# Patient Record
Sex: Male | Born: 2000 | Race: Black or African American | Hispanic: No | Marital: Single | Smoking: Current some day smoker
Health system: Southern US, Community
[De-identification: ages and names within clinical notes are randomized; demographics above are authoritative.]

## PROBLEM LIST (undated history)

## (undated) ENCOUNTER — Emergency Department (HOSPITAL_COMMUNITY): Admission: EM | Payer: Medicaid Other | Source: Home / Self Care

---
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
## 2018-12-29 ENCOUNTER — Telehealth: Payer: Self-pay | Admitting: Orthopedic Surgery

## 2018-12-29 NOTE — Telephone Encounter (Signed)
Patient's mom called to set up an appointment with Dr. Romeo Apple for the patient. I explained that since he has Medicaid he would need to be referred by his PCP. I also explained that since he is of age, when we get the referral we would have to speak with him to schedule the appointment.

## 2019-02-21 ENCOUNTER — Encounter (HOSPITAL_COMMUNITY): Payer: Self-pay

## 2019-02-21 ENCOUNTER — Other Ambulatory Visit: Payer: Self-pay

## 2019-02-21 ENCOUNTER — Emergency Department (HOSPITAL_COMMUNITY)
Admission: EM | Admit: 2019-02-21 | Discharge: 2019-02-21 | Disposition: A | Discharge status: Home / Self Care | Payer: Self-pay | Attending: Emergency Medicine | Admitting: Emergency Medicine

## 2019-02-21 ENCOUNTER — Emergency Department (HOSPITAL_COMMUNITY): Payer: Self-pay

## 2019-02-21 DIAGNOSIS — Y9389 Activity, other specified: Secondary | ICD-10-CM | POA: Insufficient documentation

## 2019-02-21 DIAGNOSIS — S6702XA Crushing injury of left thumb, initial encounter: Secondary | ICD-10-CM | POA: Insufficient documentation

## 2019-02-21 DIAGNOSIS — Y929 Unspecified place or not applicable: Secondary | ICD-10-CM | POA: Insufficient documentation

## 2019-02-21 DIAGNOSIS — W231XXA Caught, crushed, jammed, or pinched between stationary objects, initial encounter: Secondary | ICD-10-CM | POA: Insufficient documentation

## 2019-02-21 DIAGNOSIS — Y998 Other external cause status: Secondary | ICD-10-CM | POA: Insufficient documentation

## 2019-02-21 DIAGNOSIS — S6710XA Crushing injury of unspecified finger(s), initial encounter: Secondary | ICD-10-CM

## 2019-02-21 DIAGNOSIS — S6010XA Contusion of unspecified finger with damage to nail, initial encounter: Secondary | ICD-10-CM

## 2019-02-21 DIAGNOSIS — S60112A Contusion of left thumb with damage to nail, initial encounter: Secondary | ICD-10-CM | POA: Insufficient documentation

## 2019-02-21 MED ORDER — ACETAMINOPHEN 500 MG PO TABS
1000.0000 mg | ORAL_TABLET | Freq: Once | ORAL | Status: AC
  Administered 2019-02-21: 1000 mg via ORAL
  Filled 2019-02-21: qty 2

## 2019-02-21 NOTE — ED Provider Notes (Addendum)
[facility] EMERGENCY DEPARTMENT Provider Note   CSN: [id number] Arrival date & time: 02/21/19  [id number]    History   Chief Complaint Chief Complaint  Patient presents with  . Finger Injury    left thumb    HPI Frederick Stewart is a 18 y.o. male.     Patient c/o accidentally shutting left thumb in car door last night. Pain is acute onset, constant, moderate, persistent, worse w palpation. Skin is intact. Denies other pain or injury. Right hand dominant. Has not taken anything for pain as of yet.     The history is provided by the patient.    No past medical history on file.  Patient Active Problem List   Diagnosis Date Noted  . Eczema 05/29/2015  . Sports physical 08/21/2013  . Prolonged Q-T interval on ECG 08/21/2013  . Bradycardia 08/21/2013  . Well child check 08/18/2013  . Need for prophylactic vaccination and inoculation against influenza 08/18/2013  . BMI (body mass index), pediatric, 5% to less than 85% for age 52/24/2014  . Acne 08/18/2013    No past surgical history on file.      Home Medications    Prior to Admission medications   Medication Sig Start Date End Date Taking? Authorizing Provider  acetaminophen-codeine (TYLENOL #3) 300-30 MG per tablet Take 1 tablet by mouth every 6 (six) hours as needed for moderate pain. Patient not taking: Reported on 11/27/2014 10/15/14   Vickki HearingHarrison, Stanley E, MD  BENZACLIN gel Apply topically 2 (two) times daily. Patient not taking: Reported on 01/06/2017 03/16/14   Martyn EhrichKhalifa, Dalia A, MD  hydrocortisone 2.5 % ointment Apply topically 2 (two) times daily. 05/29/15   Lurene ShadowGnanasekaran, Kavithashree, MD  hydrocortisone valerate cream (WESTCORT) 0.2 % Apply 1 application topically 2 (two) times daily. 03/16/14   Laurell JosephsKhalifa, Dalia A, MD  ibuprofen (ADVIL,MOTRIN) 600 MG tablet Take 1 tablet (600 mg total) by mouth every 6 (six) hours as needed. 01/06/17   Lavera GuiseLiu, Dana Duo, MD  permethrin (ELIMITE) 5 % cream Apply 1 application topically once.  Patient not taking: Reported on 01/06/2017 05/29/15   Lurene ShadowGnanasekaran, Kavithashree, MD    Family History No family history on file.  Social History Social History   Tobacco Use  . Smoking status: Never Smoker  Substance Use Topics  . Alcohol use: No  . Drug use: No     Allergies   Patient has no known allergies.   Review of Systems Review of Systems  Constitutional: Negative for fever.  Musculoskeletal:       Left thumb pain  Skin: Negative for wound.  Neurological: Negative for numbness.     Physical Exam Updated Vital Signs Ht 1.778 m (5\' 10" )   Wt 68 kg   BMI 21.52 kg/m   Physical Exam Vitals signs and nursing note reviewed.  Constitutional:      Appearance: Normal appearance. He is well-developed.  HENT:     Head: Atraumatic.     Nose: Nose normal.  Eyes:     General: No scleral icterus.    Conjunctiva/sclera: Conjunctivae normal.  Neck:     Trachea: No tracheal deviation.  Cardiovascular:     Pulses: Normal pulses.  Pulmonary:     Effort: Pulmonary effort is normal. No accessory muscle usage or respiratory distress.  Genitourinary:    Comments: No cva tenderness. Musculoskeletal:     Comments: Swelling and tenderness to distal phalanx of left thumb. Nail is intact. Approximately 1/2 of thumb nail with subungual hematoma.  Normal cap refill distally in thumb. Skin intact.   Skin:    General: Skin is warm and dry.     Findings: No rash.  Neurological:     Mental Status: He is alert.     Comments: Alert, speech clear. Able to flex/extend thumb at IP joint. sens grossly intact.   Psychiatric:        Mood and Affect: Mood normal.      ED Treatments / Results  Labs (all labs ordered are listed, but only abnormal results are displayed) Labs Reviewed - No data to display  EKG None  Radiology Dg Finger Thumb Left  Result Date: 02/21/2019 CLINICAL DATA:  Slammed thumb in car door. Pain. Initial encounter. EXAM: LEFT THUMB 2+V COMPARISON:  None.  FINDINGS: Soft tissue swelling is present the distal thumb. There is no underlying fracture. No radiopaque foreign body is present. IMPRESSION: 1. Soft tissue swelling over the distal thumb without acute or focal osseous abnormality. Electronically Signed   By: Marin Roberts M.D.   On: 02/21/2019 05:50    Procedures Procedures (including critical care time)  Medications Ordered in ED Medications  acetaminophen (TYLENOL) tablet 1,000 mg (has no administration in time range)     Initial Impression / Assessment and Plan / ED Course  I have reviewed the triage vital signs and the nursing notes.  Pertinent labs & imaging results that were available during my care of the patient were reviewed by me and considered in my medical decision making (see chart for details).  Acetaminophen po.   icepack to sore area.   Xrays.   Reviewed nursing notes and prior charts for additional history.   xrays reviewed by me - no fracture. Discussed w pt.   Patient gave verbal consent for trephination nail after discussing tx options with him. Nail trephinated with good relief of symptoms, tolerated well. Sterile dressing.     Final Clinical Impressions(s) / ED Diagnoses   Final diagnoses:  None    ED Discharge Orders    None          Cathren Laine, MD 02/21/19 [phone]

## 2019-02-21 NOTE — Discharge Instructions (Addendum)
It was our pleasure to provide your ER care today - we hope that you feel better.  Keep area very clean. Elevate thumb. Icepack to sore area.   Take acetaminophen or ibuprofen as need for pain.  Return to ER if worse, other concern.

## 2019-02-21 NOTE — ED Triage Notes (Signed)
Pt reports slamming left thumb in car door last night. Pt reports using ice, denies taking anything for pain at home. No other symptoms reported.

## 2019-09-04 ENCOUNTER — Emergency Department (HOSPITAL_COMMUNITY)
Admission: EM | Admit: 2019-09-04 | Discharge: 2019-09-04 | Disposition: A | Discharge status: Home / Self Care | Payer: Medicaid Other | Attending: Emergency Medicine | Admitting: Emergency Medicine

## 2019-09-04 ENCOUNTER — Emergency Department (HOSPITAL_COMMUNITY): Payer: Medicaid Other

## 2019-09-04 ENCOUNTER — Encounter (HOSPITAL_COMMUNITY): Payer: Self-pay | Admitting: Emergency Medicine

## 2019-09-04 ENCOUNTER — Other Ambulatory Visit: Payer: Self-pay

## 2019-09-04 DIAGNOSIS — F17290 Nicotine dependence, other tobacco product, uncomplicated: Secondary | ICD-10-CM | POA: Diagnosis not present

## 2019-09-04 DIAGNOSIS — L02214 Cutaneous abscess of groin: Secondary | ICD-10-CM | POA: Diagnosis not present

## 2019-09-04 DIAGNOSIS — R1909 Other intra-abdominal and pelvic swelling, mass and lump: Secondary | ICD-10-CM | POA: Diagnosis not present

## 2019-09-04 DIAGNOSIS — R10814 Left lower quadrant abdominal tenderness: Secondary | ICD-10-CM | POA: Diagnosis present

## 2019-09-04 DIAGNOSIS — L0291 Cutaneous abscess, unspecified: Secondary | ICD-10-CM

## 2019-09-04 LAB — CBC WITH DIFFERENTIAL/PLATELET
Abs Immature Granulocytes: 0.04 10*3/uL (ref 0.00–0.07)
Basophils Absolute: 0 10*3/uL (ref 0.0–0.1)
Basophils Relative: 0 %
Eosinophils Absolute: 0.1 10*3/uL (ref 0.0–0.5)
Eosinophils Relative: 1 %
HCT: 50.2 % (ref 39.0–52.0)
Hemoglobin: 17.2 g/dL — ABNORMAL HIGH (ref 13.0–17.0)
Immature Granulocytes: 0 %
Lymphocytes Relative: 11 %
Lymphs Abs: 1.4 10*3/uL (ref 0.7–4.0)
MCH: 29.6 pg (ref 26.0–34.0)
MCHC: 34.3 g/dL (ref 30.0–36.0)
MCV: 86.3 fL (ref 80.0–100.0)
Monocytes Absolute: 0.9 10*3/uL (ref 0.1–1.0)
Monocytes Relative: 7 %
Neutro Abs: 10.3 10*3/uL — ABNORMAL HIGH (ref 1.7–7.7)
Neutrophils Relative %: 81 %
Platelets: 196 10*3/uL (ref 150–400)
RBC: 5.82 MIL/uL — ABNORMAL HIGH (ref 4.22–5.81)
RDW: 11.9 % (ref 11.5–15.5)
WBC: 12.7 10*3/uL — ABNORMAL HIGH (ref 4.0–10.5)
nRBC: 0 % (ref 0.0–0.2)

## 2019-09-04 LAB — COMPREHENSIVE METABOLIC PANEL
ALT: 20 U/L (ref 0–44)
AST: 15 U/L (ref 15–41)
Albumin: 4.5 g/dL (ref 3.5–5.0)
Alkaline Phosphatase: 58 U/L (ref 38–126)
Anion gap: 9 (ref 5–15)
BUN: 9 mg/dL (ref 6–20)
CO2: 25 mmol/L (ref 22–32)
Calcium: 9.4 mg/dL (ref 8.9–10.3)
Chloride: 105 mmol/L (ref 98–111)
Creatinine, Ser: 1.22 mg/dL (ref 0.61–1.24)
GFR calc Af Amer: >60 mL/min (ref >60)
GFR calc non Af Amer: >60 mL/min (ref >60)
Glucose, Bld: 104 mg/dL — ABNORMAL HIGH (ref 70–99)
Potassium: 3.7 mmol/L (ref 3.5–5.1)
Sodium: 139 mmol/L (ref 135–145)
Total Bilirubin: 1.6 mg/dL — ABNORMAL HIGH (ref 0.3–1.2)
Total Protein: 7.6 g/dL (ref 6.5–8.1)

## 2019-09-04 MED ORDER — IOHEXOL 300 MG/ML  SOLN
100.0000 mL | Freq: Once | INTRAMUSCULAR | Status: AC | PRN
  Administered 2019-09-04: 09:00:00 100 mL via INTRAVENOUS

## 2019-09-04 MED ORDER — LIDOCAINE-EPINEPHRINE (PF) 1 %-1:200000 IJ SOLN
10.0000 mL | Freq: Once | INTRAMUSCULAR | Status: AC
  Administered 2019-09-04: 10 mL
  Filled 2019-09-04: qty 30

## 2019-09-04 MED ORDER — MORPHINE SULFATE (PF) 2 MG/ML IV SOLN
INTRAVENOUS | Status: AC
  Administered 2019-09-04: 4 mg via INTRAVASCULAR
  Filled 2019-09-04: qty 2

## 2019-09-04 MED ORDER — MORPHINE SULFATE (PF) 4 MG/ML IV SOLN: 4.0000 mg | Freq: Once | INTRAVENOUS | Status: DC

## 2019-09-04 MED ORDER — ONDANSETRON HCL 4 MG/2ML IJ SOLN
4.0000 mg | Freq: Once | INTRAMUSCULAR | Status: AC
  Administered 2019-09-04: 4 mg via INTRAVENOUS
  Filled 2019-09-04: qty 2

## 2019-09-04 MED ORDER — NAPROXEN 500 MG PO TABS: ORAL_TABLET | ORAL | 0 refills | Status: DC

## 2019-09-04 MED ORDER — SULFAMETHOXAZOLE-TRIMETHOPRIM 800-160 MG PO TABS: 1.0000 | ORAL_TABLET | Freq: Two times a day (BID) | ORAL | 0 refills | Status: AC

## 2019-09-04 NOTE — ED Provider Notes (Signed)
INCISION AND DRAINAGE ABSCESS LEFT GROIN  Patient is a 18 year old male who presents to the emergency department with a complaint of a knot in the left groin.  He says he first noticed it yesterday, November 8.  He has not had fever or chills, he has not had normal nausea or vomiting.  Examination reveals an abscess in the inguinal area on the left.  I discussed with the patient the need for incision and drainage.  I discussed the procedure with him in detail.  Patient gives permission for the procedure.  Patient identified by armband.  Procedural timeout taken.  The area was cleansed with alcohol, and then painted with Betadine.  The abscess area was infiltrated with 1% lidocaine with epinephrine.  After appropriate anesthetic, incision and drainage was carried out in sterile fashion.  A culture was obtained.  The wound was irrigated.  Sterile dressing was applied.  The patient received medication for assistance with pain.  The patient tolerated the procedure without problem.   Lily Kocher, PA-C 09/04/19 1039    Milton Ferguson, MD 09/05/19 [phone]

## 2019-09-04 NOTE — ED Provider Notes (Signed)
[facility] EMERGENCY DEPARTMENT Provider Note   CSN: [id number] Arrival date & time: 09/04/19  7342     History   Chief Complaint Chief Complaint  Patient presents with  . Groin Pain    HPI Frederick Stewart is a 18 y.o. male.     Patient states that he has pain in his left groin that started yesterday  The history is provided by the patient. No language interpreter was used.  Groin Pain This is a new problem. The current episode started 12 to 24 hours ago. The problem occurs constantly. The problem has not changed since onset.Pertinent negatives include no chest pain, no abdominal pain and no headaches. The symptoms are aggravated by twisting. Nothing relieves the symptoms. He has tried nothing for the symptoms.    History reviewed. No pertinent past medical history.  Patient Active Problem List   Diagnosis Date Noted  . Eczema 05/29/2015  . Sports physical 08/21/2013  . Prolonged Q-T interval on ECG 08/21/2013  . Bradycardia 08/21/2013  . Well child check 08/18/2013  . Need for prophylactic vaccination and inoculation against influenza 08/18/2013  . BMI (body mass index), pediatric, 5% to less than 85% for age 66/24/2014  . Acne 08/18/2013    History reviewed. No pertinent surgical history.      Home Medications    Prior to Admission medications   Medication Sig Start Date End Date Taking? Authorizing Provider  acetaminophen-codeine (TYLENOL #3) 300-30 MG per tablet Take 1 tablet by mouth every 6 (six) hours as needed for moderate pain. Patient not taking: Reported on 09/04/2019 10/15/14   Vickki Hearing, MD  BENZACLIN gel Apply topically 2 (two) times daily. Patient not taking: Reported on 01/06/2017 03/16/14   Martyn Ehrich A, MD  hydrocortisone 2.5 % ointment Apply topically 2 (two) times daily. Patient not taking: Reported on 09/04/2019 05/29/15   Lurene Shadow, MD  hydrocortisone valerate cream (WESTCORT) 0.2 % Apply 1 application topically 2  (two) times daily. Patient not taking: Reported on 09/04/2019 03/16/14   Laurell Josephs, MD  ibuprofen (ADVIL,MOTRIN) 600 MG tablet Take 1 tablet (600 mg total) by mouth every 6 (six) hours as needed. Patient not taking: Reported on 09/04/2019 01/06/17   Lavera Guise, MD  naproxen (NAPROSYN) 500 MG tablet Take one pill q 12 hours as needed for pain 09/04/19   Bethann Berkshire, MD  permethrin (ELIMITE) 5 % cream Apply 1 application topically once. Patient not taking: Reported on 01/06/2017 05/29/15   Lurene Shadow, MD  sulfamethoxazole-trimethoprim (BACTRIM DS) 800-160 MG tablet Take 1 tablet by mouth 2 (two) times daily for 7 days. 09/04/19 09/11/19  Bethann Berkshire, MD    Family History No family history on file.  Social History Social History   Tobacco Use  . Smoking status: Current Some Day Smoker    Types: Cigars  . Smokeless tobacco: Never Used  . Tobacco comment: 1-2 cigars  Substance Use Topics  . Alcohol use: No  . Drug use: No     Allergies   Patient has no known allergies.   Review of Systems Review of Systems  Constitutional: Negative for appetite change and fatigue.  HENT: Negative for congestion, ear discharge and sinus pressure.   Eyes: Negative for discharge.  Respiratory: Negative for cough.   Cardiovascular: Negative for chest pain.  Gastrointestinal: Negative for abdominal pain and diarrhea.  Genitourinary: Negative for frequency and hematuria.       Left groin pain  Musculoskeletal: Negative for back  pain.  Skin: Negative for rash.  Neurological: Negative for seizures and headaches.  Psychiatric/Behavioral: Negative for hallucinations.     Physical Exam Updated Vital Signs BP 102/63 (BP Location: Right Arm)   Pulse 79   Temp 98.3 F (36.8 C) (Oral)   Resp 16   Ht 5\' 10"  (1.778 m)   Wt 68 kg   SpO2 98%   BMI 21.52 kg/m   Physical Exam Vitals signs and nursing note reviewed.  Constitutional:      Appearance: He is well-developed.   HENT:     Head: Normocephalic.  Eyes:     General: No scleral icterus.    Conjunctiva/sclera: Conjunctivae normal.  Neck:     Musculoskeletal: Neck supple.     Thyroid: No thyromegaly.  Cardiovascular:     Rate and Rhythm: Normal rate and regular rhythm.     Heart sounds: No murmur. No friction rub. No gallop.   Pulmonary:     Breath sounds: No stridor. No wheezing or rales.  Chest:     Chest wall: No tenderness.  Abdominal:     General: There is no distension.     Tenderness: There is no abdominal tenderness. There is no rebound.  Genitourinary:    Comments: Swelling tenderness to left groin Musculoskeletal: Normal range of motion.  Lymphadenopathy:     Cervical: No cervical adenopathy.  Skin:    Findings: No erythema or rash.  Neurological:     Mental Status: He is oriented to person, place, and time.     Motor: No abnormal muscle tone.     Coordination: Coordination normal.  Psychiatric:        Behavior: Behavior normal.      ED Treatments / Results  Labs (all labs ordered are listed, but only abnormal results are displayed) Labs Reviewed  CBC WITH DIFFERENTIAL/PLATELET - Abnormal; Notable for the following components:      Result Value   WBC 12.7 (*)    RBC 5.82 (*)    Hemoglobin 17.2 (*)    Neutro Abs 10.3 (*)    All other components within normal limits  COMPREHENSIVE METABOLIC PANEL - Abnormal; Notable for the following components:   Glucose, Bld 104 (*)    Total Bilirubin 1.6 (*)    All other components within normal limits  AEROBIC CULTURE (SUPERFICIAL SPECIMEN)    EKG None  Radiology Ct Abdomen Pelvis W Contrast  Result Date: 09/04/2019 CLINICAL DATA:  Left groin mass since yesterday. Clinical concern for a hernia. EXAM: CT ABDOMEN AND PELVIS WITH CONTRAST TECHNIQUE: Multidetector CT imaging of the abdomen and pelvis was performed using the standard protocol following bolus administration of intravenous contrast. CONTRAST:  144mL OMNIPAQUE  IOHEXOL 300 MG/ML  SOLN COMPARISON:  None. FINDINGS: Lower chest: Unremarkable. Hepatobiliary: No focal liver abnormality is seen. No gallstones, gallbladder wall thickening, or biliary dilatation. Pancreas: Unremarkable. No pancreatic ductal dilatation or surrounding inflammatory changes. Spleen: Normal in size without focal abnormality. Adrenals/Urinary Tract: Adrenal glands are unremarkable. Kidneys are normal, without renal calculi, focal lesion, or hydronephrosis. Bladder is unremarkable. Stomach/Bowel: Stomach is within normal limits. Appendix appears normal. No evidence of bowel wall thickening, distention, or inflammatory changes. Vascular/Lymphatic: No significant vascular findings are present. No enlarged abdominal or pelvic lymph nodes. Reproductive: Prostate is unremarkable. Other: Superficial left groin fluid collection with a moderately thickened surrounding rim with enhancement and soft tissue stranding in the adjacent subcutaneous fat. The fluid collection is just beneath the skin and measures 2.1 x 2.0  x 1.4 cm. No hernia seen. 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 There is also moderate diffuse posterior disc protrusion at the L5-S1 level without significant canal or foraminal stenosis. IMPRESSION: 1. 2.1 x 2.0 x 1.4 cm left groin subcutaneous abscess. 2. 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 3. Moderate diffuse posterior disc protrusion at the L5-S1 level without significant canal or foraminal stenosis. Electronically Signed   By: Beckie SaltsSteven  Reid M.D.   On: 09/04/2019 09:23    Procedures Procedures (including critical care time)  Medications Ordered in ED Medications  morphine 4 MG/ML injection 4 mg (4 mg Intravenous Not Given 09/04/19 1003)  iohexol (OMNIPAQUE) 300 MG/ML solution 100 mL (100 mLs Intravenous Contrast Given 09/04/19 0907)  ondansetron (ZOFRAN)  injection 4 mg (4 mg Intravenous Given 09/04/19 1001)  lidocaine-EPINEPHrine (XYLOCAINE-EPINEPHrine) 1 %-1:200000 (PF) injection 10 mL (10 mLs Other Given 09/04/19 1002)  morphine 2 MG/ML injection (4 mg Intravascular Given 09/04/19 1002)     Initial Impression / Assessment and Plan / ED Course  I have reviewed the triage vital signs and the nursing notes.  Pertinent labs & imaging results that were available during my care of the patient were reviewed by me and considered in my medical decision making (see chart for details).        CT scan shows abscess.  Patient had abscess I&D by physician assistant Gates RiggHopson Bryant.  Patient felt much better.  Patient  Final Clinical Impressions(s) / ED Diagnoses   Final diagnoses:  Abscess    ED Discharge Orders         Ordered    sulfamethoxazole-trimethoprim (BACTRIM DS) 800-160 MG tablet  2 times daily     09/04/19 1053    naproxen (NAPROSYN) 500 MG tablet     09/04/19 1053           Bethann BerkshireZammit, Arbor Cohen, MD 09/04/19 1059

## 2019-09-04 NOTE — ED Triage Notes (Signed)
Pt states that he has a knot on the left side of his groin he just noticed yesterday.

## 2019-09-04 NOTE — Discharge Instructions (Addendum)
Follow-up in 2 to 3 days for recheck.  Clean area twice a day gently with soap and water

## 2019-09-06 LAB — AEROBIC CULTURE W GRAM STAIN (SUPERFICIAL SPECIMEN)

## 2019-11-02 ENCOUNTER — Other Ambulatory Visit: Payer: Self-pay

## 2019-11-02 ENCOUNTER — Ambulatory Visit (INDEPENDENT_AMBULATORY_CARE_PROVIDER_SITE_OTHER): Payer: Medicaid Other | Admitting: Pediatrics

## 2019-11-02 ENCOUNTER — Ambulatory Visit (INDEPENDENT_AMBULATORY_CARE_PROVIDER_SITE_OTHER): Payer: Medicaid Other | Admitting: Licensed Clinical Social Worker

## 2019-11-02 VITALS — BP 116/80 | Ht 68.5 in | Wt 147.8 lb

## 2019-11-02 DIAGNOSIS — L700 Acne vulgaris: Secondary | ICD-10-CM

## 2019-11-02 DIAGNOSIS — Z0001 Encounter for general adult medical examination with abnormal findings: Secondary | ICD-10-CM

## 2019-11-02 DIAGNOSIS — Z0000 Encounter for general adult medical examination without abnormal findings: Secondary | ICD-10-CM | POA: Diagnosis not present

## 2019-11-02 DIAGNOSIS — Z00129 Encounter for routine child health examination without abnormal findings: Secondary | ICD-10-CM

## 2019-11-02 MED ORDER — BENZACLIN 1-5 % EX GEL: Freq: Two times a day (BID) | CUTANEOUS | 3 refills | Status: DC

## 2019-11-02 NOTE — BH Specialist Note (Signed)
Integrated Behavioral Health Initial Visit  MRN: [id number] Name: Frederick Stewart  Number of Integrated Behavioral Health Clinician visits:: 1/6 Session Start time: 2:40pm  Session End time: 2:50pm Total time: 10 mins  Type of Service: Integrated Behavioral Health- Individual Interpretor:No.    Warm Hand Off Completed.     SUBJECTIVE: Frederick Stewart is a 19 y.o. male who attended the appointment alone. Patient was referred by Koren Shiver to provide information about adult PCP providers in area and offer support on efforts to get a job. Patient reports the following symptoms/concerns: Patient will be during 19 next month and will need a new PCP for ongoing care.  Patient is also having trouble deciding what rout he wants to take for his career.  Duration of problem: several months; Severity of problem: mild  OBJECTIVE: Mood: NA and Affect: Appropriate Risk of harm to self or others: No plan to harm self or others  LIFE CONTEXT: Family and Social: Patient lives with Mom and younger sibling.  Patient reports that Mom is very supportive and wants him to stay at home although he is ready to consider moving out in the near future.  School/Work: Patient reports that he did one semester of College but did not feel like it was right for him.  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  Self-Care: Patient does acknowledge that he smokes Marijuana on occasion.  Life Changes: None Reported  GOALS ADDRESSED: Patient will: 1. Reduce symptoms of: stress 2. Increase knowledge and/or ability of: coping skills and healthy habits  3. Demonstrate ability to: Increase healthy adjustment to current life circumstances  INTERVENTIONS: Interventions utilized: Psychoeducation and/or Health Education  Standardized Assessments completed: PHQ 9 Modified for Teens-score of 0.    ASSESSMENT: Patient currently experiencing transition in insurance coverage as he will turn 19 and lose Medicaid coverage in the next month.  Patient reports that he is also currently trying to decide which direction he would like to go in for work/school.  Patient was provided with a list of local adult Clinics accepting new Patient's for his ongoing medical care.  Patient was also provided with an overview of BH services offered in clinic and in general how counseling may be able to help him work through barriers with decision making and motivation (if present) towards his next steps in supporting himself.    Patient may benefit from continued follow up as needed.  PLAN: 1. Follow up with behavioral health clinician as needed 2. Behavioral recommendations: return as needed 3. Referral(s): Integrated [employer] (In Clinic)   Frederick Stewart, [facility]

## 2019-11-02 NOTE — Progress Notes (Signed)
Adolescent Well Care Visit Frederick Stewart is a 19 y.o. male who is here for well care.    PCP:  Patient, No Pcp Per   History was provided by the patient.  Confidentiality was discussed with the patient and, if applicable, with caregiver as well. Patient's personal or confidential phone number: [phone]   Current Issues: Current concerns include none  Nutrition: Nutrition/Eating Behaviors: fair diet Adequate calcium in diet?: not much Supplements/ Vitamins: none Water - gallon a day Juice - 4-5 daily  Exercise/ Media: Play any Sports?/ Exercise: not much Screen Time:  > 2 hours-counseling provided Media Rules or Monitoring?: yes  Sleep:  Sleep: sleeps okay, gets about 8 hours   Social Screening: Lives with:  Lives with mom, and brother, dog  Parental relations:  good Activities, Work, and Research officer, political party?: looking for a job Concerns regarding behavior with peers?  no Stressors of note: no  Education: School Name: Has [employer], and a semester of college.  Working Designer, fashion/clothing,  School Grade: Freshman in [employer] performance: doing well; no concerns except - religion class  School Behavior: doing well; no concerns  Confidential Social History: Tobacco?  yes, black and mild, smokes 1-2 daily Secondhand smoke exposure?  yes, mom Drugs/ETOH?  yes, smokes a little weed.    Sexually Active?  yes   Pregnancy Prevention: condoms every time, but his girlfriend cheated on him a few times so he would like STI testing   Safe at home, in school & in relationships?  Yes Safe to self?  Yes   Screenings: Patient has a dental home: yes, last visit last year  PHQ-9 completed and results indicated no problems  Physical Exam:  Vitals:   11/02/19 1335  BP: 116/80  Weight: 147 lb 12.8 oz (67 kg)  Height: 5' 8.5" (1.74 m)   BP 116/80   Ht 5' 8.5" (1.74 m)   Wt 147 lb 12.8 oz (67 kg)   BMI 22.15 kg/m  Body mass index: body mass index is 22.15  kg/m. Growth percentile SmartLinks can only be used for patients less than 87 years old.   Hearing Screening   125Hz  250Hz  500Hz  1000Hz  2000Hz  3000Hz  4000Hz  6000Hz  8000Hz   Right ear:   20 20 20 20 20     Left ear:   20 20 20 20 20       Visual Acuity Screening   Right eye Left eye Both eyes  Without correction: 20/25 20/25   With correction:       General Appearance:   alert, oriented, no acute distress and well nourished  HENT: Normocephalic, no obvious abnormality, conjunctiva clear, sty on the lower lid of the right eye.  Mouth:   Normal appearing teeth, no obvious discoloration, dental caries, or dental caps  Neck:   Supple; thyroid: no enlargement, symmetric, no tenderness/mass/nodules  Chest Normal male  Lungs:   Clear to auscultation bilaterally, normal work of breathing  Heart:   Regular rate and rhythm, S1 and S2 normal, no murmurs;   Abdomen:   Soft, non-tender, no mass, or organomegaly  GU Tanner stage 5, normal male  Musculoskeletal:   Tone and strength strong and symmetrical, all extremities               Lymphatic:   No cervical adenopathy  Skin/Hair/Nails:   Skin warm, dry and intact, no rashes, no bruises or petechiae  Neurologic:   Strength, gait, and coordination normal and age-appropriate     Assessment and Plan:  This is a 19 year old male who needs to transition to adult care.   BMI is appropriate for age  Hearing screening result:normal Vision screening result: normal  Counseling provided for all of the vaccine components  Orders Placed This Encounter  Procedures  . GC/Chlamydia Probe Amp([facility])   Referral to ophthalmology for sty. Referral to behavioral health to help find resources for school or work STI testing completed.  Return in 1 year (on 11/01/2020).Fredia Sorrow, NP

## 2019-11-03 LAB — GC/CHLAMYDIA PROBE AMP
Chlamydia trachomatis, NAA: NEGATIVE
Neisseria Gonorrhoeae by PCR: NEGATIVE

## 2019-11-07 LAB — HSV(HERPES SIMPLEX VRS) I + II AB-IGM: HSVI/II Comb IgM: <0.91 Ratio (ref 0.00–0.90)

## 2019-11-07 LAB — CBC WITH DIFFERENTIAL/PLATELET
Basophils Absolute: 0 10*3/uL (ref 0.0–0.2)
Basos: 0 %
EOS (ABSOLUTE): 0.3 10*3/uL (ref 0.0–0.4)
Eos: 4 %
Hematocrit: 49.6 % (ref 37.5–51.0)
Hemoglobin: 17.7 g/dL (ref 13.0–17.7)
Immature Grans (Abs): 0 10*3/uL (ref 0.0–0.1)
Immature Granulocytes: 0 %
Lymphocytes Absolute: 1.4 10*3/uL (ref 0.7–3.1)
Lymphs: 18 %
MCH: 30.1 pg (ref 26.6–33.0)
MCHC: 35.7 g/dL (ref 31.5–35.7)
MCV: 84 fL (ref 79–97)
Monocytes Absolute: 0.5 10*3/uL (ref 0.1–0.9)
Monocytes: 7 %
Neutrophils Absolute: 5.4 10*3/uL (ref 1.4–7.0)
Neutrophils: 71 %
Platelets: 222 10*3/uL (ref 150–450)
RBC: 5.89 x10E6/uL — ABNORMAL HIGH (ref 4.14–5.80)
RDW: 13.7 % (ref 11.6–15.4)
WBC: 7.7 10*3/uL (ref 3.4–10.8)

## 2019-11-07 LAB — COMPREHENSIVE METABOLIC PANEL
Chloride: 113 mmol/L — ABNORMAL HIGH (ref 96–106)
Potassium: 5.5 mmol/L — ABNORMAL HIGH (ref 3.5–5.2)
Sodium: 144 mmol/L (ref 134–144)

## 2019-11-07 LAB — HIV ANTIBODY (ROUTINE TESTING W REFLEX): HIV Screen 4th Generation wRfx: NONREACTIVE

## 2019-11-07 LAB — LIPID PANEL WITH LDL/HDL RATIO

## 2019-11-07 LAB — RPR: RPR Ser Ql: NONREACTIVE

## 2019-11-08 ENCOUNTER — Telehealth: Payer: Self-pay

## 2019-11-08 ENCOUNTER — Other Ambulatory Visit: Payer: Self-pay | Admitting: Pediatrics

## 2019-11-08 DIAGNOSIS — Z00121 Encounter for routine child health examination with abnormal findings: Secondary | ICD-10-CM

## 2019-11-08 NOTE — Progress Notes (Signed)
Labs were not able to be drawn due to quantity insufficient, labs to be redrawn at [facility].

## 2019-11-08 NOTE — Telephone Encounter (Signed)
TC to pt to let him know that he will need to come pick up lab orders from our office to have labs drawn at [facility]. Pt explained he would pick up orders tomorrow and go get the bloodwork done.

## 2019-11-13 NOTE — Progress Notes (Signed)
Called and spoke with patient.  Gave him the results of the STI testing and requesting that he come by the office to have the lipid panel and CMP redrawn.  Orders put in the chart.

## 2019-11-14 DIAGNOSIS — Z0001 Encounter for general adult medical examination with abnormal findings: Secondary | ICD-10-CM | POA: Diagnosis not present

## 2019-11-15 LAB — LIPID PANEL WITH LDL/HDL RATIO
Cholesterol, Total: 173 mg/dL (ref 100–199)
HDL: 48 mg/dL (ref >39)
LDL Chol Calc (NIH): 106 mg/dL — ABNORMAL HIGH (ref 0–99)
LDL/HDL Ratio: 2.2 ratio (ref 0.0–3.6)
Triglycerides: 105 mg/dL (ref 0–149)
VLDL Cholesterol Cal: 19 mg/dL (ref 5–40)

## 2019-11-15 LAB — COMPREHENSIVE METABOLIC PANEL
ALT: 52 IU/L — ABNORMAL HIGH (ref 0–44)
AST: 31 IU/L (ref 0–40)
Albumin/Globulin Ratio: 2.2 (ref 1.2–2.2)
Albumin: 5.1 g/dL (ref 4.1–5.2)
Alkaline Phosphatase: 62 IU/L (ref 39–117)
BUN/Creatinine Ratio: 8 — ABNORMAL LOW (ref 9–20)
BUN: 11 mg/dL (ref 6–20)
Bilirubin Total: 1.2 mg/dL (ref 0.0–1.2)
CO2: 23 mmol/L (ref 20–29)
Calcium: 9.8 mg/dL (ref 8.7–10.2)
Chloride: 102 mmol/L (ref 96–106)
Creatinine, Ser: 1.37 mg/dL — ABNORMAL HIGH (ref 0.76–1.27)
Globulin, Total: 2.3 g/dL (ref 1.5–4.5)
Glucose: 92 mg/dL (ref 65–99)
Potassium: 4.4 mmol/L (ref 3.5–5.2)
Sodium: 141 mmol/L (ref 134–144)
Total Protein: 7.4 g/dL (ref 6.0–8.5)

## 2019-11-22 ENCOUNTER — Ambulatory Visit: Payer: Medicaid Other

## 2020-01-20 ENCOUNTER — Emergency Department (HOSPITAL_COMMUNITY)
Admission: EM | Admit: 2020-01-20 | Discharge: 2020-01-20 | Disposition: A | Discharge status: Home / Self Care | Payer: Medicaid Other | Attending: Emergency Medicine | Admitting: Emergency Medicine

## 2020-01-20 ENCOUNTER — Encounter (HOSPITAL_COMMUNITY): Payer: Self-pay

## 2020-01-20 ENCOUNTER — Other Ambulatory Visit: Payer: Self-pay

## 2020-01-20 DIAGNOSIS — F17290 Nicotine dependence, other tobacco product, uncomplicated: Secondary | ICD-10-CM | POA: Insufficient documentation

## 2020-01-20 DIAGNOSIS — N492 Inflammatory disorders of scrotum: Secondary | ICD-10-CM

## 2020-01-20 DIAGNOSIS — N454 Abscess of epididymis or testis: Secondary | ICD-10-CM | POA: Insufficient documentation

## 2020-01-20 DIAGNOSIS — L0291 Cutaneous abscess, unspecified: Secondary | ICD-10-CM | POA: Diagnosis present

## 2020-01-20 MED ORDER — DOXYCYCLINE HYCLATE 100 MG PO TABS
100.0000 mg | ORAL_TABLET | Freq: Once | ORAL | Status: AC
  Administered 2020-01-20: 04:00:00 100 mg via ORAL
  Filled 2020-01-20: qty 1

## 2020-01-20 MED ORDER — LIDOCAINE-EPINEPHRINE (PF) 2 %-1:200000 IJ SOLN
10.0000 mL | Freq: Once | INTRAMUSCULAR | Status: AC
  Administered 2020-01-20: 10 mL via INTRADERMAL
  Filled 2020-01-20: qty 10

## 2020-01-20 MED ORDER — DOXYCYCLINE HYCLATE 100 MG PO CAPS: 100.0000 mg | ORAL_CAPSULE | Freq: Two times a day (BID) | ORAL | 0 refills | Status: DC

## 2020-01-20 MED ORDER — ACETAMINOPHEN 500 MG PO TABS
1000.0000 mg | ORAL_TABLET | Freq: Once | ORAL | Status: AC
  Administered 2020-01-20: 1000 mg via ORAL
  Filled 2020-01-20: qty 2

## 2020-01-20 MED ORDER — OXYCODONE HCL 5 MG PO TABS
5.0000 mg | ORAL_TABLET | Freq: Once | ORAL | Status: AC
  Administered 2020-01-20: 5 mg via ORAL
  Filled 2020-01-20: qty 1

## 2020-01-20 NOTE — ED Triage Notes (Signed)
Pt states he has a knot in his left groin, states he had one in the same place a year ago that had to be opened and drained.

## 2020-01-20 NOTE — ED Notes (Signed)
Patient signed discharge but signature pad did not link signature. Discharge instructions and prescriptions explained to patient. Patient given 4x4 dressing material.

## 2020-01-20 NOTE — ED Provider Notes (Signed)
[facility] EMERGENCY DEPARTMENT Provider Note   CSN: [id number] Arrival date & time: 01/20/20  9983     History Chief Complaint  Patient presents with  . Abscess    Frederick Stewart is a 19 y.o. male.  19 yo M with a cc of left scrotal abscess.  Was seen in the ED previously for an abscess in the exact same location.  Thinks it may be due to an ingrown hair.  Has been feeling mildly unwell but denies fevers nausea or vomiting.  He denies recent risky sexual encounter.  Denies penile discharge.  Denies penile rash.  The history is provided by the patient.  Abscess Location:  Pelvis Pelvic abscess location:  Scrotum Size:  4cm Abscess quality: induration and painful   Red streaking: no   Duration:  2 days Progression:  Worsening Pain details:    Severity:  Moderate   Duration:  2 days   Progression:  Worsening Chronicity:  New Relieved by:  Nothing Worsened by:  Nothing Ineffective treatments:  None tried Associated symptoms: no fever, no headaches and no vomiting        History reviewed. No pertinent past medical history.  Patient Active Problem List   Diagnosis Date Noted  . Eczema 05/29/2015  . Sports physical 08/21/2013  . Prolonged Q-T interval on ECG 08/21/2013  . Bradycardia 08/21/2013  . Well child check 08/18/2013  . Need for prophylactic vaccination and inoculation against influenza 08/18/2013  . BMI (body mass index), pediatric, 5% to less than 85% for age 62/24/2014  . Acne 08/18/2013    History reviewed. No pertinent surgical history.     No family history on file.  Social History   Tobacco Use  . Smoking status: Current Some Day Smoker    Types: Cigars  . Smokeless tobacco: Never Used  . Tobacco comment: 1-2 cigars  Substance Use Topics  . Alcohol use: No  . Drug use: No    Home Medications Prior to Admission medications   Medication Sig Start Date End Date Taking? Authorizing Provider  BENZACLIN gel Apply topically 2 (two) times  daily. 11/02/19   Fredia Sorrow, NP  doxycycline (VIBRAMYCIN) 100 MG capsule Take 1 capsule (100 mg total) by mouth 2 (two) times daily. One po bid x 7 days 01/20/20   Melene Plan, DO    Allergies    Patient has no known allergies.  Review of Systems   Review of Systems  Constitutional: Negative for chills and fever.  HENT: Negative for congestion and facial swelling.   Eyes: Negative for discharge and visual disturbance.  Respiratory: Negative for shortness of breath.   Cardiovascular: Negative for chest pain and palpitations.  Gastrointestinal: Negative for abdominal pain, diarrhea and vomiting.  Genitourinary: Positive for scrotal swelling.  Musculoskeletal: Negative for arthralgias and myalgias.  Skin: Negative for color change and rash.  Neurological: Negative for tremors, syncope and headaches.  Psychiatric/Behavioral: Negative for confusion and dysphoric mood.    Physical Exam Updated Vital Signs BP 122/82 (BP Location: Left Arm)   Pulse 93   Temp 99 F (37.2 C) (Oral)   Resp 15   Ht 5\' 10"  (1.778 m)   Wt 70.3 kg   SpO2 98%   BMI 22.24 kg/m   Physical Exam Vitals and nursing note reviewed.  Constitutional:      Appearance: He is well-developed.  HENT:     Head: Normocephalic and atraumatic.  Eyes:     Pupils: Pupils are equal, round,  and reactive to light.  Neck:     Vascular: No JVD.  Cardiovascular:     Rate and Rhythm: Normal rate and regular rhythm.     Heart sounds: No murmur. No friction rub. No gallop.   Pulmonary:     Effort: No respiratory distress.     Breath sounds: No wheezing.  Abdominal:     General: There is no distension.     Tenderness: There is no guarding or rebound.  Genitourinary:   Musculoskeletal:        General: Normal range of motion.     Cervical back: Normal range of motion and neck supple.  Skin:    Coloration: Skin is not pale.     Findings: No rash.  Neurological:     Mental Status: He is alert and oriented to  person, place, and time.  Psychiatric:        Behavior: Behavior normal.     ED Results / Procedures / Treatments   Labs (all labs ordered are listed, but only abnormal results are displayed) Labs Reviewed - No data to display  EKG None  Radiology No results found.  Procedures .Marland KitchenIncision and Drainage  Date/Time: 01/20/2020 4:06 AM Performed by: Deno Etienne, DO Authorized by: Deno Etienne, DO   Consent:    Consent obtained:  Verbal   Consent given by:  Patient   Risks discussed:  Bleeding, incomplete drainage, damage to other organs and infection   Alternatives discussed:  No treatment, delayed treatment and alternative treatment Location:    Type:  Abscess   Size:  Duck egg   Location:  Anogenital   Anogenital location:  Scrotal space Pre-procedure details:    Skin preparation:  Chloraprep Anesthesia (see MAR for exact dosages):    Anesthesia method:  Local infiltration   Local anesthetic:  Lidocaine 2% WITH epi Procedure type:    Complexity:  Complex Procedure details:    Incision types:  Single straight   Incision depth:  Subcutaneous   Scalpel blade:  11   Wound management:  Probed and deloculated   Drainage:  Purulent and bloody   Drainage amount:  Copious   Wound treatment:  Wound left open   Packing materials:  None Post-procedure details:    Patient tolerance of procedure:  Tolerated well, no immediate complications   (including critical care time) Emergency Focused Ultrasound Exam Limited Ultrasound of Soft Tissue   Performed and interpreted by Dr. Tyrone Nine Indication: evaluation for infection or foreign body Transverse and Sagittal views of left groin are obtained in real time for the purposes of evaluation of skin and underlying soft tissues.  Findings:  heterogeneous fluid collection, without hyperemia/edema of surrounding tissue Interpretation:  abscess, without cellulitis Images archived electronically.  CPT Codes:    Pelvic wall  [id number]     Medications Ordered in ED Medications  doxycycline (VIBRA-TABS) tablet 100 mg (has no administration in time range)  lidocaine-EPINEPHrine (XYLOCAINE W/EPI) 2 %-1:200000 (PF) injection 10 mL (10 mLs Intradermal Given by Other 01/20/20 0407)  acetaminophen (TYLENOL) tablet 1,000 mg (1,000 mg Oral Given 01/20/20 0324)  oxyCODONE (Oxy IR/ROXICODONE) immediate release tablet 5 mg (5 mg Oral Given 01/20/20 0324)    ED Course  I have reviewed the triage vital signs and the nursing notes.  Pertinent labs & imaging results that were available during my care of the patient were reviewed by me and considered in my medical decision making (see chart for details).    MDM Rules/Calculators/A&P  19 yo M with a chief complaints of pain and swelling to the left side of his scrotum.  Patient states that he had a similar event happened recently and was told he had an abscess.  Had it drained and had some improvement.  Is in an interesting location for an abscess.  Almost in the inguinal canal.  Ultrasound is consistent with a localized fluid collection.  Superficial to the inguinal canal.  Will perform I&D.  I&D with copious drainage.  Started on antibiotics.  Urology follow-up as this is reaccumulated.  4:07 AM:  I have discussed the diagnosis/risks/treatment options with the patient and believe the pt to be eligible for discharge home to follow-up with PCP, urology. We also discussed returning to the ED immediately if new or worsening sx occur. We discussed the sx which are most concerning (e.g., sudden worsening pain, fever, inability to tolerate by mouth) that necessitate immediate return. Medications administered to the patient during their visit and any new prescriptions provided to the patient are listed below.  Medications given during this visit Medications  doxycycline (VIBRA-TABS) tablet 100 mg (has no administration in time range)  lidocaine-EPINEPHrine  (XYLOCAINE W/EPI) 2 %-1:200000 (PF) injection 10 mL (10 mLs Intradermal Given by Other 01/20/20 0407)  acetaminophen (TYLENOL) tablet 1,000 mg (1,000 mg Oral Given 01/20/20 0324)  oxyCODONE (Oxy IR/ROXICODONE) immediate release tablet 5 mg (5 mg Oral Given 01/20/20 0324)     The patient appears reasonably screen and/or stabilized for discharge and I doubt any other medical condition or other [facility] requiring further screening, evaluation, or treatment in the ED at this time prior to discharge.   Final Clinical Impression(s) / ED Diagnoses Final diagnoses:  Scrotal wall abscess    Rx / DC Orders ED Discharge Orders         Ordered    doxycycline (VIBRAMYCIN) 100 MG capsule  2 times daily     01/20/20 0404           Melene Plan, DO 01/20/20 0407

## 2020-01-20 NOTE — Discharge Instructions (Signed)
Warm compresses at least 4 times a day.  This should keep this open and draining.  Take the antibiotics as prescribed.  Please follow-up with the urologist in the office.

## 2021-08-20 ENCOUNTER — Ambulatory Visit: Payer: Medicaid Other | Admitting: Orthopedic Surgery

## 2021-08-26 ENCOUNTER — Other Ambulatory Visit: Payer: Self-pay

## 2021-08-26 ENCOUNTER — Encounter (HOSPITAL_COMMUNITY): Payer: Self-pay | Admitting: *Deleted

## 2021-08-26 DIAGNOSIS — L739 Follicular disorder, unspecified: Secondary | ICD-10-CM | POA: Insufficient documentation

## 2021-08-26 DIAGNOSIS — F17290 Nicotine dependence, other tobacco product, uncomplicated: Secondary | ICD-10-CM | POA: Insufficient documentation

## 2021-08-26 DIAGNOSIS — R222 Localized swelling, mass and lump, trunk: Secondary | ICD-10-CM | POA: Diagnosis present

## 2021-08-26 NOTE — ED Notes (Signed)
Signature pad not working during triage, [employer] waiver read and explained to patient, he verbalized understanding of waiver, no further questions.

## 2021-08-26 NOTE — ED Triage Notes (Signed)
Pt states he has a knot on his lower chest for about 1-2 weeks that is painful.

## 2021-08-27 ENCOUNTER — Encounter: Payer: Self-pay | Admitting: Orthopedic Surgery

## 2021-08-27 ENCOUNTER — Ambulatory Visit: Payer: Medicaid Other | Admitting: Orthopedic Surgery

## 2021-08-27 ENCOUNTER — Other Ambulatory Visit: Payer: Self-pay

## 2021-08-27 ENCOUNTER — Ambulatory Visit: Payer: Medicaid Other

## 2021-08-27 ENCOUNTER — Emergency Department (HOSPITAL_COMMUNITY)
Admission: EM | Admit: 2021-08-27 | Discharge: 2021-08-27 | Disposition: A | Discharge status: Home / Self Care | Payer: Medicaid Other | Attending: Emergency Medicine | Admitting: Emergency Medicine

## 2021-08-27 VITALS — BP 133/87 | HR 67 | Ht 70.0 in | Wt 153.0 lb

## 2021-08-27 DIAGNOSIS — S43005A Unspecified dislocation of left shoulder joint, initial encounter: Secondary | ICD-10-CM | POA: Diagnosis not present

## 2021-08-27 DIAGNOSIS — L739 Follicular disorder, unspecified: Secondary | ICD-10-CM

## 2021-08-27 NOTE — ED Provider Notes (Signed)
[facility] EMERGENCY DEPARTMENT Provider Note   CSN: [id number] Arrival date & time: 08/26/21  2244     History No chief complaint on file.   Frederick Stewart is a 20 y.o. male.  HPI  Is a 20 year old male who presents with concerns for a bump on his chest.  Patient has noted a bump on his chest for the last 1 to 2 weeks.  It is painful.  It worsened and became more swollen after he got hit in basketball.  However, it has reduced in size again.  No spontaneous drainage.  He has not taken anything for his symptoms.  He has not used warm hot compresses.  Rates his pain at 5 out of 10.  History reviewed. No pertinent past medical history.  Patient Active Problem List   Diagnosis Date Noted   Eczema 05/29/2015   Sports physical 08/21/2013   Prolonged Q-T interval on ECG 08/21/2013   Bradycardia 08/21/2013   Well child check 08/18/2013   Need for prophylactic vaccination and inoculation against influenza 08/18/2013   BMI (body mass index), pediatric, 5% to less than 85% for age 73/24/2014   Acne 08/18/2013    History reviewed. No pertinent surgical history.     No family history on file.  Social History   Tobacco Use   Smoking status: Some Days    Types: Cigars   Smokeless tobacco: Never   Tobacco comments:    1-2 cigars  Vaping Use   Vaping Use: Never used  Substance Use Topics   Alcohol use: No   Drug use: No    Home Medications Prior to Admission medications   Medication Sig Start Date End Date Taking? Authorizing Provider  BENZACLIN gel Apply topically 2 (two) times daily. 11/02/19   Fredia Sorrow, NP  doxycycline (VIBRAMYCIN) 100 MG capsule Take 1 capsule (100 mg total) by mouth 2 (two) times daily. One po bid x 7 days 01/20/20   Melene Plan, DO    Allergies    Patient has no known allergies.  Review of Systems   Review of Systems  Constitutional:  Negative for fever.  Respiratory:  Negative for shortness of breath.   Cardiovascular:  Negative for  chest pain.  Skin:  Negative for color change and wound.       Bump  All other systems reviewed and are negative.  Physical Exam Updated Vital Signs BP 125/84 (BP Location: Right Arm)   Pulse 65   Temp 97.8 F (36.6 C) (Oral)   Resp 18   SpO2 100%   Physical Exam Vitals and nursing note reviewed.  Constitutional:      Appearance: He is well-developed. He is not ill-appearing.  HENT:     Head: Normocephalic and atraumatic.     Nose: Nose normal.     Mouth/Throat:     Mouth: Mucous membranes are moist.  Eyes:     Pupils: Pupils are equal, round, and reactive to light.  Cardiovascular:     Rate and Rhythm: Normal rate and regular rhythm.     Comments: Tenderness to palpation with a small bump noted just inferior to the sternum, no fluctuance or induration, no overlying skin changes, no spontaneous drainage Pulmonary:     Effort: Pulmonary effort is normal. No respiratory distress.  Abdominal:     Palpations: Abdomen is soft.     Tenderness: There is no abdominal tenderness.  Musculoskeletal:        General: Normal range of motion.  Cervical back: Neck supple.  Skin:    General: Skin is warm and dry.  Neurological:     Mental Status: He is alert and oriented to person, place, and time.  Psychiatric:        Mood and Affect: Mood normal.    ED Results / Procedures / Treatments   Labs (all labs ordered are listed, but only abnormal results are displayed) Labs Reviewed - No data to display  EKG None  Radiology No results found.  Procedures Procedures   Medications Ordered in ED Medications - No data to display  ED Course  I have reviewed the triage vital signs and the nursing notes.  Pertinent labs & imaging results that were available during my care of the patient were reviewed by me and considered in my medical decision making (see chart for details).    MDM Rules/Calculators/A&P                           Patient presents with a small bump over the  inferior portion of the sternum.  He is overall nontoxic and vital signs are reassuring.  No fluctuance or induration.  Doubt abscess although early abscess would be a consideration.  Early folliculitis would also be consideration.  No evidence of cellulitis.  Recommend warm compresses and ibuprofen.  After history, exam, and medical workup I feel the patient has been appropriately medically screened and is safe for discharge home. Pertinent diagnoses were discussed with the patient. Patient was given return precautions.  Final Clinical Impression(s) / ED Diagnoses Final diagnoses:  Folliculitis    Rx / DC Orders ED Discharge Orders     None        Tattiana Fakhouri, Mayer Masker, MD 08/27/21 [phone]

## 2021-08-27 NOTE — Progress Notes (Signed)
New Patient Visit  Assessment: Frederick Stewart is a 20 y.o. male with the following: 1. Dislocation of left shoulder joint, initial encounter  Plan: Patient reports multiple dislocations of the left shoulder.  He also has dislocations of the right shoulder.  Most recently, he dislocated his left shoulder approximately 2 weeks ago.  He reports that he has had approximately 20 such dislocations  His strength and range of motion is pretty good, although he does have some pain in his left shoulder.  Given the number of dislocations, and general instability, I have recommended an MRI for further evaluation.  Pending the results of the MRI, he may discuss surgery which could include arthroscopy versus an open bony block type procedure.  This was briefly discussed.  All questions were answered.  Follow-up once the MRI is completed.   Follow-up: Return for After MRI.  Subjective:  Chief Complaint  Patient presents with   Shoulder Pain    Lt shoulder history of recurring dislocations, last time 2 wks ago.     History of Present Illness: Frederick Stewart is a 20 y.o. male who presents for evaluation of his left shoulder.  He reports multiple dislocations.  2 weeks ago, he was playing bass ball, when he had a dislocation.  He did require some assistance in getting his shoulder relocated.  He has not had 2.  Presented to the emergency department for formal reduction.  Most the time, he is able to get it back into place by himself.  More recently, it is becoming more difficult.  He has required assistance for the last couple times.  He continues to have pain in the left shoulder.   Review of Systems: No fevers or chills No numbness or tingling No chest pain No shortness of breath No bowel or bladder dysfunction No GI distress No headaches   Medical History:  History reviewed. No pertinent past medical history.  History reviewed. No pertinent surgical history.  History reviewed. No pertinent  family history. Social History   Tobacco Use   Smoking status: Some Days    Types: Cigars   Smokeless tobacco: Never   Tobacco comments:    1-2 cigars  Vaping Use   Vaping Use: Never used  Substance Use Topics   Alcohol use: No   Drug use: No    No Known Allergies  No outpatient medications have been marked as taking for the 08/27/21 encounter (Office Visit) with Oliver Barre, MD.    Objective: BP 133/87   Pulse 67   Ht 5\' 10"  (1.778 m)   Wt 153 lb (69.4 kg)   BMI 21.95 kg/m   Physical Exam:  General: Alert and oriented. and No acute distress. Gait: Normal gait.  Evaluation left shoulder demonstrates no deformity.  No sulcus sign.  Overall muscle bulk within normal limits.  Positive apprehension and relocation testing.  Internal rotation to T12, slightly restricted compared to the contralateral side.  Pain with external rotation at his side.  No pain in the empty can testing position.  Strength is 5/5 otherwise, with some discomfort in the left shoulder.  IMAGING: I personally ordered and reviewed the following images  X-ray of the left shoulder was obtained in clinic today and demonstrates no acute injuries.  He does have a loose body within the axillary pouch.  Glenohumeral joint space is narrowed, especially in the inferior aspect of the glenoid.  Minimal osteophytes.  No evidence of chronic rotator cuff injury.  Impression: Left  shoulder x-ray, with sequelae of chronic shoulder dislocations  New Medications:  No orders of the defined types were placed in this encounter.     Oliver Barre, MD  08/27/2021 2:12 PM

## 2021-08-27 NOTE — Discharge Instructions (Signed)
You were seen today for a bump on your chest.  This may be related to a clogged pore or folliculitis.  Apply warm compresses.  Take ibuprofen for pain.

## 2021-08-28 ENCOUNTER — Telehealth: Payer: Self-pay

## 2021-08-28 NOTE — Telephone Encounter (Signed)
Transition Care Management Follow-up Telephone Call Date of discharge and from where: 08/27/2021 from [facility] How have you been since you were released from the hospital? Pt stated that the pain in the area is worsening.  Any questions or concerns? No  Items Reviewed: Did the pt receive and understand the discharge instructions provided? Yes  Medications obtained and verified? Yes  Other? No  Any new allergies since your discharge? No  Dietary orders reviewed? No Do you have support at home? Yes   Functional Questionnaire: (I = Independent and D = Dependent) ADLs: I Bathing/Dressing- I Meal Prep- I Eating- I Maintaining continence- I Transferring/Ambulation- I Managing Meds- I  Follow up appointments reviewed: PCP Hospital f/u appt confirmed? No   Specialist Hospital f/u appt confirmed? No   Are transportation arrangements needed? No  If their condition worsens, is the pt aware to call PCP or go to the Emergency Dept.? Yes Was the patient provided with contact information for the PCP's office or ED? Yes Was to pt encouraged to call back with questions or concerns? Yes

## 2021-09-16 ENCOUNTER — Ambulatory Visit (HOSPITAL_COMMUNITY)
Admission: RE | Admit: 2021-09-16 | Discharge: 2021-09-16 | Disposition: A | Discharge status: Home / Self Care | Payer: Medicaid Other | Source: Ambulatory Visit | Attending: Orthopedic Surgery | Admitting: Orthopedic Surgery

## 2021-09-16 ENCOUNTER — Other Ambulatory Visit: Payer: Self-pay

## 2021-09-16 DIAGNOSIS — S43005A Unspecified dislocation of left shoulder joint, initial encounter: Secondary | ICD-10-CM | POA: Insufficient documentation

## 2021-09-16 DIAGNOSIS — S43015A Anterior dislocation of left humerus, initial encounter: Secondary | ICD-10-CM | POA: Diagnosis not present

## 2021-09-16 DIAGNOSIS — M25512 Pain in left shoulder: Secondary | ICD-10-CM | POA: Diagnosis not present

## 2022-09-30 IMAGING — MR MR SHOULDER*L* W/O CM
5 series · 40 of 40 positions shown · non-contrast
Comparison: Left shoulder x-rays dated August 27, 2021.

CLINICAL DATA: Left shoulder pain for the past 2 months.

EXAM:
MRI OF THE LEFT SHOULDER WITHOUT CONTRAST
TECHNIQUE: Multiplanar, multisequence MR imaging of the shoulder was performed.
No intravenous contrast was administered.

[Series 5: T2 fat-sat · axial · left · 4.0mm · 0.47mm/px · z∈[-60,+58]mm · 8 of 25 slices shown (1 of 3)]
[im 1/25]
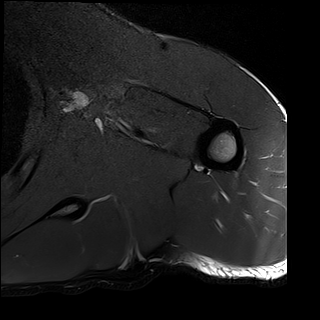
[im 4/25]
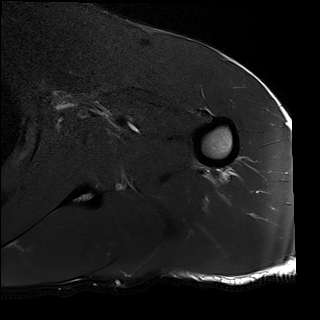
[im 7/25]
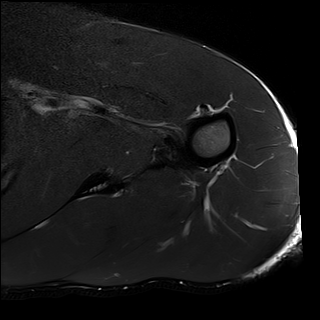
[im 11/25]
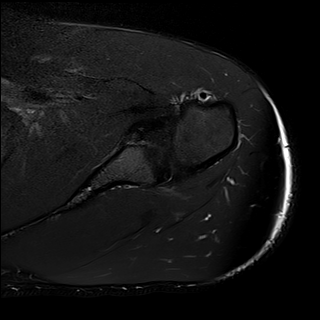
[im 14/25]
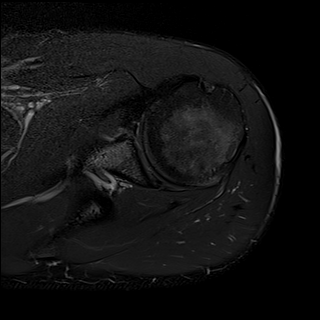
[im 18/25]
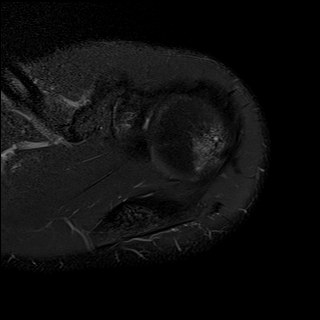
[im 21/25]
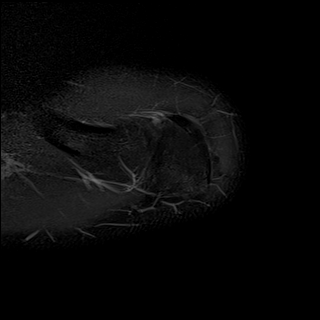
[im 25/25]
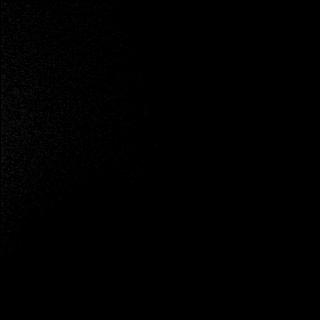

[Series 6: T2 fat-sat · oblique · left · 4.0mm · 0.47mm/px · 8 of 26 slices shown (2 of 3)]
[im 1/26]
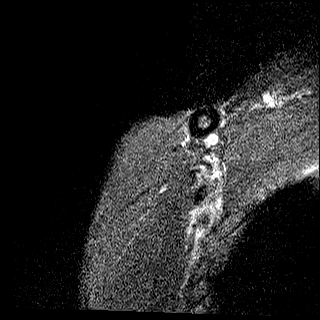
[im 4/26]
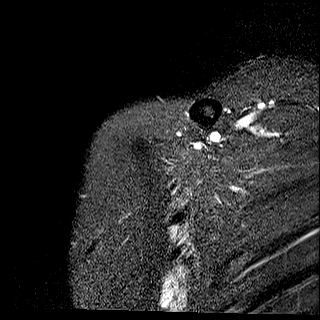
[im 8/26]
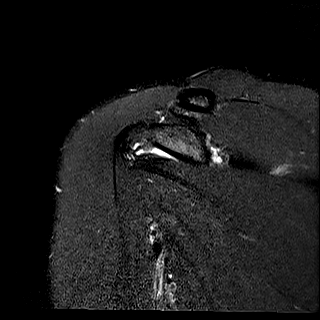
[im 11/26]
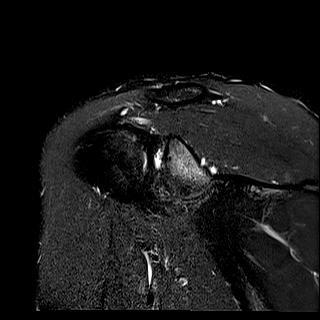
[im 15/26]
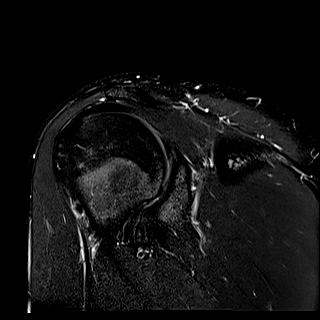
[im 18/26]
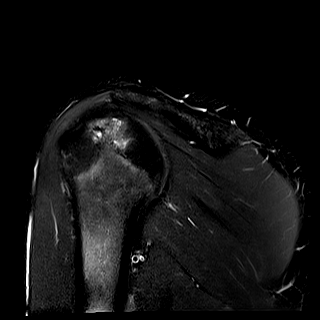
[im 22/26]
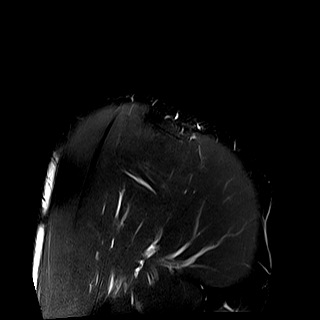
[im 26/26]
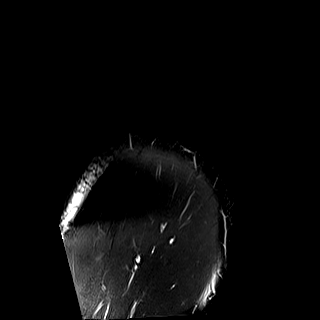

[Series 7: PD · oblique · left · 4.0mm · 0.47mm/px · 8 of 26 slices shown]
[im 1/26]
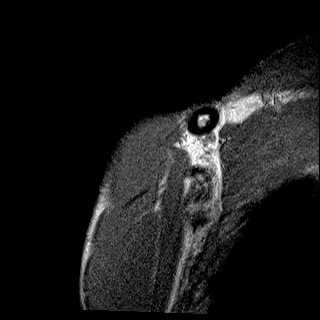
[im 4/26]
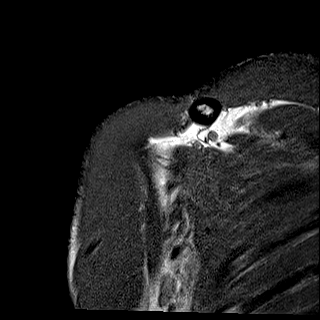
[im 8/26]
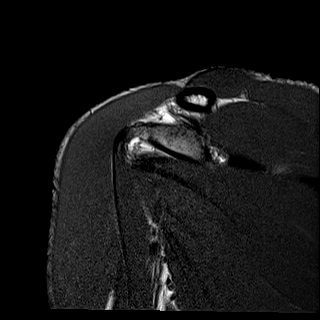
[im 11/26]
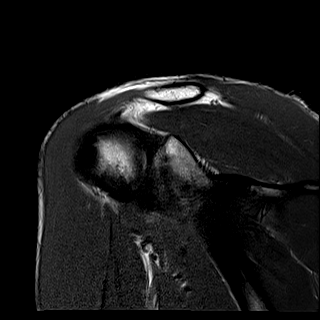
[im 15/26]
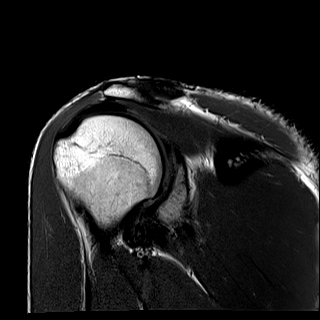
[im 18/26]
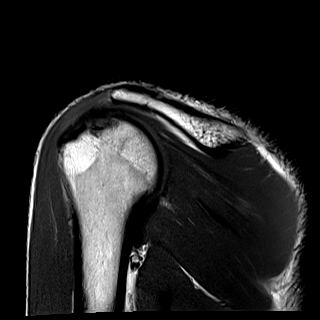
[im 22/26]
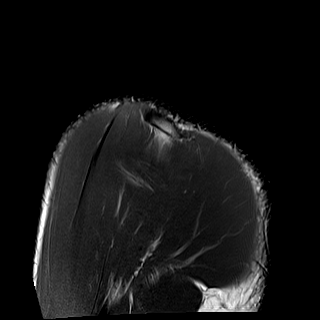
[im 26/26]
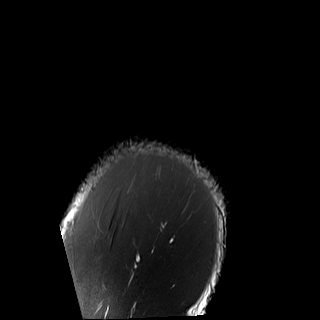

[Series 8: T2 fat-sat · oblique · left · 4.0mm · 0.44mm/px · 8 of 25 slices shown (3 of 3)]
[im 1/25]
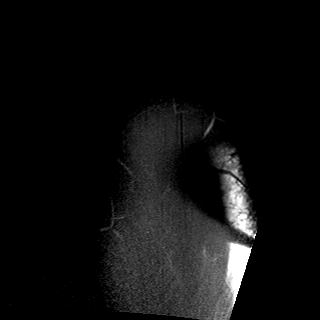
[im 4/25]
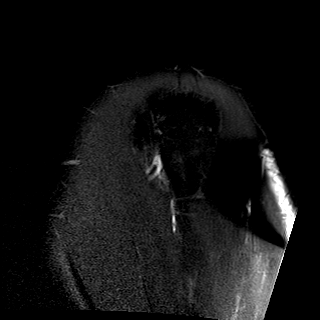
[im 7/25]
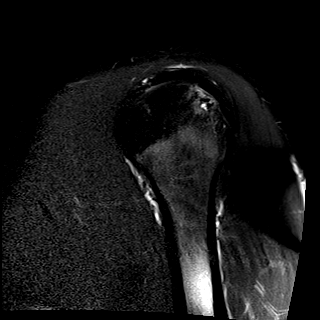
[im 11/25]
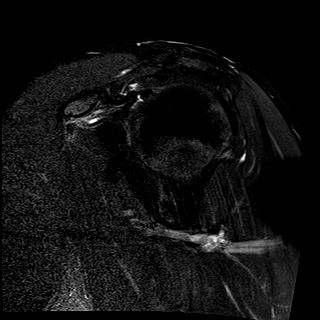
[im 14/25]
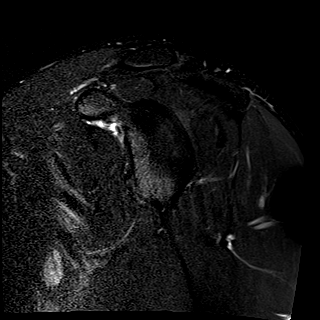
[im 18/25]
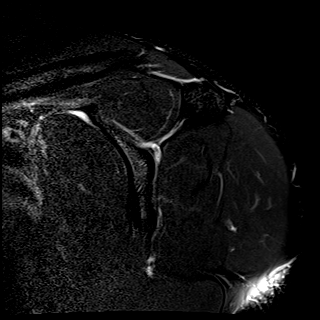
[im 21/25]
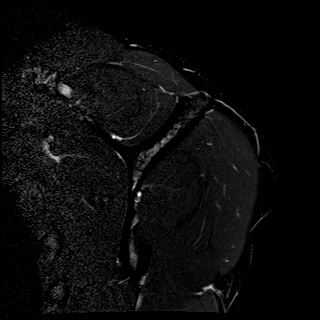
[im 25/25]
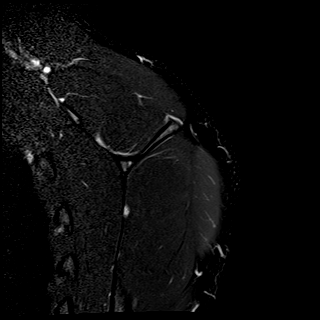

[Series 9: T1 · oblique · left · 4.0mm · 0.41mm/px · 8 of 25 slices shown]
[im 1/25]
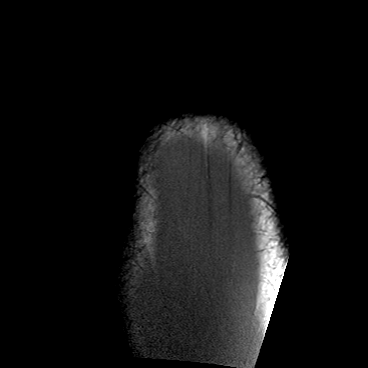
[im 4/25]
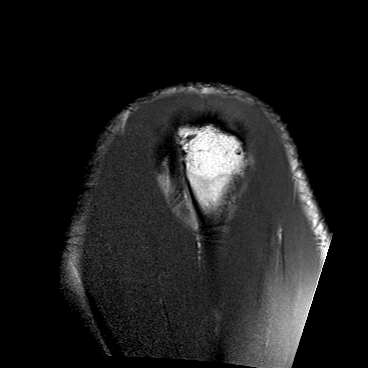
[im 7/25]
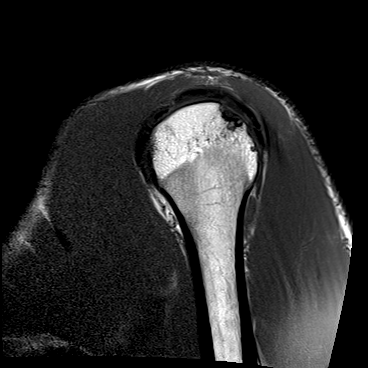
[im 11/25]
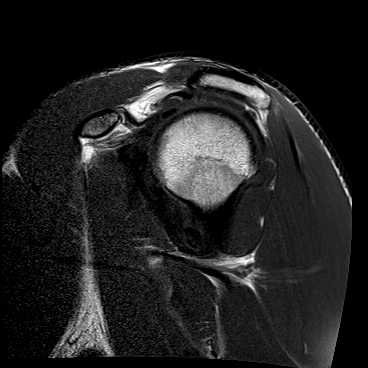
[im 14/25]
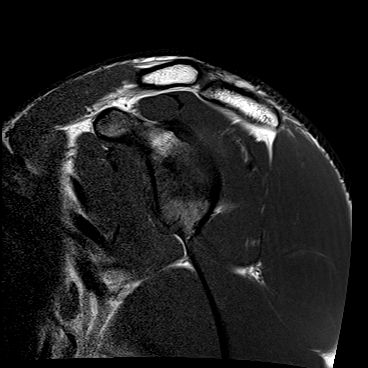
[im 18/25]
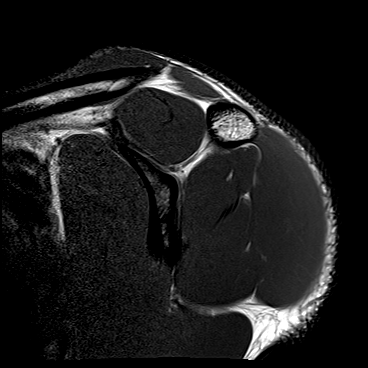
[im 21/25]
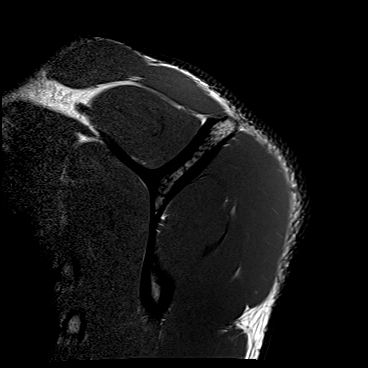
[im 25/25]
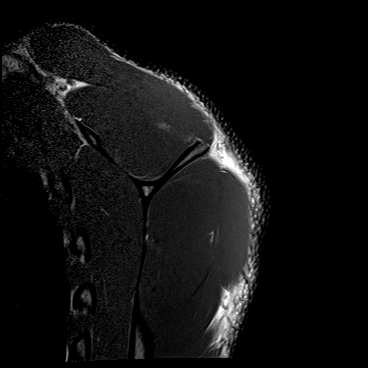

[40 of 40 positions shown; findings below may reference images not displayed]

FINDINGS: Rotator cuff:  Intact rotator cuff.

Muscles: No atrophy or abnormal signal of the muscles of the rotator
cuff.

Biceps long head:  Intact and normally positioned.

Acromioclavicular Joint: Normal acromioclavicular joint. Type II
acromion. No subacromial/subdeltoid bursal fluid.

Glenohumeral Joint: No joint effusion. No chondral defect.

Labrum: Small bony Bankart fracture of the anterior inferior
glenoid/labrum (series 5, image 16; series 9, image 13). Remaining
labrum is grossly intact.

Bones: Subacute impaction fracture of the posterosuperior humeral
head with mild residual marrow edema. No dislocation. No suspicious
bone lesion.

Other: None.
IMPRESSION: 1. Sequelae of prior anterior shoulder dislocation with small bony
Bankart fracture of the anterior inferior glenoid/labrum and
subacute Hill-Sachs impaction fracture of the posterosuperior
humeral head.

## 2023-01-15 ENCOUNTER — Other Ambulatory Visit: Payer: Self-pay

## 2023-01-15 ENCOUNTER — Emergency Department (HOSPITAL_COMMUNITY): Payer: Medicaid Other

## 2023-01-15 ENCOUNTER — Emergency Department (HOSPITAL_COMMUNITY)
Admission: EM | Admit: 2023-01-15 | Discharge: 2023-01-15 | Disposition: A | Discharge status: Home / Self Care | Payer: Medicaid Other | Attending: Emergency Medicine | Admitting: Emergency Medicine

## 2023-01-15 DIAGNOSIS — S82092A Other fracture of left patella, initial encounter for closed fracture: Secondary | ICD-10-CM | POA: Diagnosis not present

## 2023-01-15 DIAGNOSIS — S82002A Unspecified fracture of left patella, initial encounter for closed fracture: Secondary | ICD-10-CM | POA: Diagnosis not present

## 2023-01-15 DIAGNOSIS — X509XXA Other and unspecified overexertion or strenuous movements or postures, initial encounter: Secondary | ICD-10-CM | POA: Insufficient documentation

## 2023-01-15 DIAGNOSIS — Y9367 Activity, basketball: Secondary | ICD-10-CM | POA: Insufficient documentation

## 2023-01-15 DIAGNOSIS — M25562 Pain in left knee: Secondary | ICD-10-CM | POA: Diagnosis not present

## 2023-01-15 MED ORDER — IBUPROFEN 600 MG PO TABS: 600.0000 mg | ORAL_TABLET | Freq: Three times a day (TID) | ORAL | 0 refills | Status: DC

## 2023-01-15 MED ORDER — HYDROCODONE-ACETAMINOPHEN 5-325 MG PO TABS
1.0000 | ORAL_TABLET | Freq: Once | ORAL | Status: AC
  Administered 2023-01-15: 1 via ORAL
  Filled 2023-01-15: qty 1

## 2023-01-15 MED ORDER — HYDROCODONE-ACETAMINOPHEN 5-325 MG PO TABS: 1.0000 | ORAL_TABLET | Freq: Four times a day (QID) | ORAL | 0 refills | Status: DC | PRN

## 2023-01-15 NOTE — ED Notes (Signed)
ED Provider at bedside. 

## 2023-01-15 NOTE — ED Provider Notes (Signed)
[facility] Provider Note   CSN: [id number] Arrival date & time: 01/15/23  1516     History  Chief Complaint  Patient presents with   Knee Pain    Frederick Stewart is a 22 y.o. male.  The history is provided by the patient.  Knee Pain Location:  Knee Time since incident:  30 minutes Injury: yes   Knee location:  L knee Pain details:    Quality:  Sharp   Radiates to:  Does not radiate   Severity:  Moderate   Onset quality:  Sudden   Timing:  Constant   Progression:  Unchanged Chronicity:  New Dislocation: no   Relieved by:  Rest Worsened by:  Flexion Ineffective treatments:  None tried Associated symptoms: swelling   Associated symptoms: no fever, no muscle weakness, no numbness and no stiffness        Home Medications Prior to Admission medications   Medication Sig Start Date End Date Taking? Authorizing Provider  HYDROcodone-acetaminophen (NORCO/VICODIN) 5-325 MG tablet Take 1 tablet by mouth every 6 (six) hours as needed for severe pain. 01/15/23  Yes Rector Devonshire, Almyra Free, PA-C  ibuprofen (ADVIL) 600 MG tablet Take 1 tablet (600 mg total) by mouth 3 (three) times daily. 01/15/23  Yes IdolAlmyra Free, PA-C      Allergies    Patient has no known allergies.    Review of Systems   Review of Systems  Constitutional:  Negative for fever.  Musculoskeletal:  Positive for arthralgias and joint swelling. Negative for myalgias and stiffness.  Neurological:  Negative for weakness and numbness.    Physical Exam Updated Vital Signs BP 121/85 (BP Location: Right Arm)   Pulse 97   Temp 98.2 F (36.8 C) (Oral)   Resp 18   Ht 5\' 10"  (1.778 m)   Wt 72.6 kg   SpO2 99%   BMI 22.96 kg/m  Physical Exam Constitutional:      Appearance: He is well-developed.  HENT:     Head: Atraumatic.  Cardiovascular:     Comments: Pulses equal bilaterally Musculoskeletal:        General: Swelling and tenderness present.     Cervical back: Normal  range of motion.     Left knee: Bony tenderness present. Decreased range of motion. No LCL laxity, MCL laxity, ACL laxity or PCL laxity.Normal pulse.     Comments: Tender to palpation inferior left patella.  Patient can straight leg raise, increases pain but no weakness in the joint space.  Skin:    General: Skin is warm and dry.  Neurological:     Mental Status: He is alert.     Sensory: No sensory deficit.     Motor: No weakness.     Deep Tendon Reflexes: Reflexes normal.     ED Results / Procedures / Treatments   Labs (all labs ordered are listed, but only abnormal results are displayed) Labs Reviewed - No data to display  EKG None  Radiology DG Knee Complete 4 Views Left  Result Date: 01/15/2023 CLINICAL DATA:  Left knee pain.  Injury playing basketball EXAM: LEFT KNEE - COMPLETE 4+ VIEW COMPARISON:  None Available. FINDINGS: Acute transversely oriented fracture through the inferior pole of the patella with 6 mm of fracture distraction. No additional fractures. No malalignment. Assessment of joint effusion is limited by obliquity on the lateral view. Mild soft tissue swelling. IMPRESSION: Acute mildly distracted fracture through the inferior pole of the patella. Electronically Signed  By: Davina Poke D.O.   On: 01/15/2023 15:58    Procedures Procedures    Medications Ordered in ED Medications - No data to display  ED Course/ Medical Decision Making/ A&P                             Medical Decision Making Patient with left sudden onset knee pain during a basketball game, he is point tender at his lower patellar region, no obvious deformity is noted.  Amount and/or Complexity of Data Reviewed Radiology: ordered.    Details: His patella.  Fracture through the inferior pole of  Risk Prescription drug management. Risk Details: Patient is referred to orthopedics, knee immobilizer and crutches provided.           Final Clinical Impression(s) / ED  Diagnoses Final diagnoses:  Other closed fracture of left patella, initial encounter    Rx / DC Orders ED Discharge Orders          Ordered    ibuprofen (ADVIL) 600 MG tablet  3 times daily        01/15/23 1609    HYDROcodone-acetaminophen (NORCO/VICODIN) 5-325 MG tablet  Every 6 hours PRN        01/15/23 1609              Evalee Jefferson, PA-C 01/15/23 1612    Isla Pence, MD 01/15/23 1622

## 2023-01-15 NOTE — ED Triage Notes (Signed)
Patient states he was playing basketball about 30 minutes, he jumped & when he came down he heard a pop in his left knee.  States it hurts to put weight on that leg now.  Reports knee is painful & swollen.

## 2023-01-15 NOTE — Discharge Instructions (Signed)
Wear your knee immobilizer at all times, crutches for pain relief to avoid weightbearing.  You may apply ice to your knee which can help reduce swelling.  Call Dr. Aline Brochure for recheck of this injury.  He will follow you until this injury completely heals.  You can take the ibuprofen prescribed for daytime pain relief.  Have also prescribed you some hydrocodone tablets if needed for additional pain relief, this medication will cause drowsiness, do not drive within 4 hours of taking the hydrocodone.  You may find this medicine is better taken at night, it should help you get a good night sleep.

## 2023-01-15 NOTE — ED Notes (Signed)
Crutch training complete.  Return demo great.   Knee immoblizer applied and instructed pt on how to apply

## 2023-01-19 ENCOUNTER — Ambulatory Visit: Payer: Medicaid Other | Admitting: Orthopaedic Surgery

## 2023-01-19 ENCOUNTER — Encounter: Payer: Self-pay | Admitting: Orthopaedic Surgery

## 2023-01-19 VITALS — BP 121/75 | HR 57 | Ht 70.0 in | Wt 160.0 lb

## 2023-01-19 DIAGNOSIS — S82042A Displaced comminuted fracture of left patella, initial encounter for closed fracture: Secondary | ICD-10-CM

## 2023-01-19 MED ORDER — HYDROCODONE-ACETAMINOPHEN 7.5-325 MG PO TABS: ORAL_TABLET | ORAL | 0 refills | Status: DC

## 2023-01-19 NOTE — Patient Instructions (Addendum)
You will be scheduled to see Dr.Harrison, who is in our office, to discuss your treatment options  YOU HAVE BEEN PRESCRIBED A NARCOTIC/PAIN MEDICATION. DO NOT DRIVE OR OPERATE HEAVY MACHINERY WHILE USING THIS MEDICATION. PAIN MEDICATION ALSO CAUSES CONSTIPATION. YOU CAN USE A STOOL SOFTENER OR MIRALAX TO HELP WITH THIS.   DR.KEELING'S SCHEDULE IS AS FOLLOWS: TUESDAY: ALL DAY WEDNESDAY: MORNING ONLY THURSDAY: MORNING ONLY PLEASE CALL OR SEND A MESSAGE VIA MYCHART BY WEDNESDAY MORNING SO THAT I CAN SEND A REQUEST TO HIM. HE LEAVES THURSDAY BY 11:30AM. HE DOES NOT RETURN TO THE OFFICE UNTIL TUESDAY MORNINGS AND HE DOES NOT CHECK HIS WORK MESSAGES DURING HIS TIME AWAY FROM THE OFFICE.   Ice your knee for swelling. If you have anklel/foot swelling adjust your knee immobilizer as it might be on too tight.

## 2023-01-19 NOTE — Progress Notes (Signed)
   Subjective:    Patient ID: Frederick Stewart, male    DOB: July 22, 2001, 22 y.o.   MRN: [id number]  HPI He hurt his left knee playing basketball on Friday, March 22.  He went to the ER and X-rays showed: IMPRESSION: Acute mildly distracted fracture through the inferior pole of the patella.  He was given crutches and knee immobilizer and pain medicine.  He has no other injury. I have reviewed the notes and X-rays.  I have independently reviewed and interpreted x-rays of this patient done at another site by another physician or qualified health professional.    Review of Systems     Objective:   Physical Exam Vitals and nursing note reviewed. Exam conducted with a chaperone present.  Constitutional:      Appearance: He is well-developed.  HENT:     Head: Normocephalic and atraumatic.  Eyes:     Conjunctiva/sclera: Conjunctivae normal.     Pupils: Pupils are equal, round, and reactive to light.  Cardiovascular:     Rate and Rhythm: Normal rate and regular rhythm.  Pulmonary:     Effort: Pulmonary effort is normal.  Abdominal:     Palpations: Abdomen is soft.  Musculoskeletal:     Cervical back: Normal range of motion and neck supple.  Skin:    General: Skin is warm and dry.  Neurological:     Mental Status: He is alert and oriented to person, place, and time.     Cranial Nerves: No cranial nerve deficit.     Motor: No abnormal muscle tone.     Coordination: Coordination normal.     Deep Tendon Reflexes: Reflexes are normal and symmetric. Reflexes normal.  Psychiatric:        Behavior: Behavior normal.        Thought Content: Thought content normal.        Judgment: Judgment normal.           Assessment & Plan:   Encounter Diagnosis  Name Primary?   Closed displaced comminuted fracture of left patella, initial encounter Yes   I will have him see Dr. Aline Brochure.  He may or may need need surgery on this inferior pole injury.  I will change pain medicine.  I  have reviewed the [facility] web site prior to prescribing narcotic medicine for this patient.  Call if any problem.  Precautions discussed.  Electronically Signed Sanjuana Kava, MD 3/26/20243:46 PM

## 2023-01-20 ENCOUNTER — Ambulatory Visit (INDEPENDENT_AMBULATORY_CARE_PROVIDER_SITE_OTHER): Payer: Medicaid Other | Admitting: Orthopedic Surgery

## 2023-01-20 ENCOUNTER — Encounter: Payer: Self-pay | Admitting: Orthopedic Surgery

## 2023-01-20 VITALS — BP 121/75 | Ht 70.0 in | Wt 160.0 lb

## 2023-01-20 DIAGNOSIS — S82035A Nondisplaced transverse fracture of left patella, initial encounter for closed fracture: Secondary | ICD-10-CM | POA: Diagnosis not present

## 2023-01-20 DIAGNOSIS — Z01818 Encounter for other preprocedural examination: Secondary | ICD-10-CM | POA: Diagnosis not present

## 2023-01-20 DIAGNOSIS — S86812A Strain of other muscle(s) and tendon(s) at lower leg level, left leg, initial encounter: Secondary | ICD-10-CM

## 2023-01-20 NOTE — Patient Instructions (Signed)
Your surgery will be at [facility] by Dr Harrison  The hospital will contact you with a preoperative appointment to discuss Anesthesia.  Please arrive on time or 15 minutes early for the preoperative appointment, they have a very tight schedule if you are late or do not come in your surgery will be cancelled.  The phone number is [phone]. Please bring your medications with you for the appointment. They will tell you the arrival time and medication instructions when you have your preoperative evaluation. Do not wear nail polish the day of your surgery and if you take Phentermine you need to stop this medication ONE WEEK prior to your surgery. If you take Invokana, Farxiga, Jardiance, or Steglatro) - Hold 72 hours before the procedure.  If you take Ozempic,  Bydureon or Trulicity do not take for 8 days before your surgery. If you take Victoza, Rybelsis, Saxenda or Adlyxi stop 24 hours before the procedure.  Please arrive at the hospital 2 hours before procedure if scheduled at 9:30 or later in the day or at the time the nurse tells you at your preoperative visit.   If you have my chart do not use the time given in my chart use the time given to you by the nurse during your preoperative visit.   Your surgery  time may change. Please be available for phone calls the day of your surgery and the day before. The Short Stay department may need to discuss changes about your surgery time. Not reaching the you could lead to procedure delays and possible cancellation.  You must have a ride home and someone to stay with you for 24 to 48 hours. The person taking you home will receive and sign for the your discharge instructions.  Please be prepared to give your support person's name and telephone number to Central Registration. Dr Harrison will need that name and phone number post procedure.   

## 2023-01-20 NOTE — Addendum Note (Signed)
Addended byCandice Camp on: 01/20/2023 03:27 PM   Modules accepted: Orders

## 2023-01-20 NOTE — H&P (View-Only) (Signed)
 Office Visit Note   Patient: Frederick Stewart           Date of Birth: 08/12/2001           MRN: [id number] Visit Date: 01/20/2023 Requested by: Dr. J. Wayne Keeling PCP: Pcp, No  Subjective: Chief Complaint  Patient presents with   Knee Injury    Patella fracture / left     HPI: 22-year-old male was playing basketball felt a pop as he started to jump and then had trouble walking and extending his knee.  He went to the emergency room they diagnosed him with a transverse distal pole patella fracture which was confirmed after Dr. J. Wayne Keeling saw him and he sent him to me for possible surgical intervention  The patient's main complaint is pain in the left knee difficulty walking and inability to extend or straighten his left leg              ROS: Does not have any chest pain or shortness of breath  Assessment & Plan: Visit Diagnoses:  1. Closed nondisplaced transverse fracture of left patella, initial encounter   2. Patellar tendon rupture, left, initial encounter   3. Pre-op exam     Plan: ORIF patella/possible patellar tendon repair left knee  Follow-Up Instructions: No follow-ups on file.   Orders:  Orders Placed This Encounter  Procedures   Procedural/ Surgical Case Request: OPEN REDUCTION INTERNAL FIXATION (ORIF) PATELLA AND PATELLA TENDON REPAIR   No orders of the defined types were placed in this encounter.     Procedures: No procedures performed   Clinical Data: No additional findings.  Objective: Vital Signs: BP 121/75   Ht 5' 10" (1.778 m)   Wt 160 lb (72.6 kg)   BMI 22.96 kg/m   Physical Exam: Physical Exam Vitals and nursing note reviewed.  Constitutional:      Appearance: Normal appearance.  HENT:     Head: Normocephalic and atraumatic.  Eyes:     General: No scleral icterus.       Right eye: No discharge.        Left eye: No discharge.     Extraocular Movements: Extraocular movements intact.     Conjunctiva/sclera: Conjunctivae  normal.     Pupils: Pupils are equal, round, and reactive to light.  Cardiovascular:     Rate and Rhythm: Normal rate.     Pulses: Normal pulses.  Skin:    General: Skin is warm and dry.     Capillary Refill: Capillary refill takes less than 2 seconds.  Neurological:     General: No focal deficit present.     Mental Status: He is alert and oriented to person, place, and time.  Psychiatric:        Mood and Affect: Mood normal.        Behavior: Behavior normal.        Thought Content: Thought content normal.        Judgment: Judgment normal.      Ortho Exam: Examination of the left knee.  He is tender at the distal pole the patella he has an effusion.  Range of motion essentially 0.  I placed his knee at 30 degrees he could not extend.  Lachman test was negative collateral ligaments were stable.  Muscle tone was normal but there was obvious quadriceps weakness  Specialty Comments:  No specialty comments available.  Imaging: No results found.   PMFS History: Patient Active Problem List     Diagnosis Date Noted   Eczema 05/29/2015   Sports physical 08/21/2013   Prolonged Q-T interval on ECG 08/21/2013   Bradycardia 08/21/2013   Well child check 08/18/2013   Need for prophylactic vaccination and inoculation against influenza 08/18/2013   BMI (body mass index), pediatric, 5% to less than 85% for age 08/18/2013   Acne 08/18/2013   History reviewed. No pertinent past medical history.  History reviewed. No pertinent family history.  History reviewed. No pertinent surgical history. Social History   Occupational History   Not on file  Tobacco Use   Smoking status: Some Days    Types: Cigars   Smokeless tobacco: Never   Tobacco comments:    1-2 cigars  Vaping Use   Vaping Use: Never used  Substance and Sexual Activity   Alcohol use: No   Drug use: No   Sexual activity: Not on file        

## 2023-01-20 NOTE — Progress Notes (Signed)
Office Visit Note   Patient: Frederick Stewart           Date of Birth: May 10, 2001           MRN: [id number] Visit Date: 01/20/2023 Requested by: Dr. Iona Hansen PCP: Pcp, No  Subjective: Chief Complaint  Patient presents with   Knee Injury    Patella fracture / left     HPI: 22 year old male was playing basketball felt a pop as he started to jump and then had trouble walking and extending his knee.  He went to the emergency room they diagnosed him with a transverse distal pole patella fracture which was confirmed after Dr. Iona Hansen saw him and he sent him to me for possible surgical intervention  The patient's main complaint is pain in the left knee difficulty walking and inability to extend or straighten his left leg              ROS: Does not have any chest pain or shortness of breath  Assessment & Plan: Visit Diagnoses:  1. Closed nondisplaced transverse fracture of left patella, initial encounter   2. Patellar tendon rupture, left, initial encounter   3. Pre-op exam     Plan: ORIF patella/possible patellar tendon repair left knee  Follow-Up Instructions: No follow-ups on file.   Orders:  Orders Placed This Encounter  Procedures   Procedural/ Surgical Case Request: OPEN REDUCTION INTERNAL FIXATION (ORIF) PATELLA AND PATELLA TENDON REPAIR   No orders of the defined types were placed in this encounter.     Procedures: No procedures performed   Clinical Data: No additional findings.  Objective: Vital Signs: BP 121/75   Ht 5\' 10"  (1.778 m)   Wt 160 lb (72.6 kg)   BMI 22.96 kg/m   Physical Exam: Physical Exam Vitals and nursing note reviewed.  Constitutional:      Appearance: Normal appearance.  HENT:     Head: Normocephalic and atraumatic.  Eyes:     General: No scleral icterus.       Right eye: No discharge.        Left eye: No discharge.     Extraocular Movements: Extraocular movements intact.     Conjunctiva/sclera: Conjunctivae  normal.     Pupils: Pupils are equal, round, and reactive to light.  Cardiovascular:     Rate and Rhythm: Normal rate.     Pulses: Normal pulses.  Skin:    General: Skin is warm and dry.     Capillary Refill: Capillary refill takes less than 2 seconds.  Neurological:     General: No focal deficit present.     Mental Status: He is alert and oriented to person, place, and time.  Psychiatric:        Mood and Affect: Mood normal.        Behavior: Behavior normal.        Thought Content: Thought content normal.        Judgment: Judgment normal.      Ortho Exam: Examination of the left knee.  He is tender at the distal pole the patella he has an effusion.  Range of motion essentially 0.  I placed his knee at 30 degrees he could not extend.  Lachman test was negative collateral ligaments were stable.  Muscle tone was normal but there was obvious quadriceps weakness  Specialty Comments:  No specialty comments available.  Imaging: No results found.   PMFS History: Patient Active Problem List  Diagnosis Date Noted   Eczema 05/29/2015   Sports physical 08/21/2013   Prolonged Q-T interval on ECG 08/21/2013   Bradycardia 08/21/2013   Well child check 08/18/2013   Need for prophylactic vaccination and inoculation against influenza 08/18/2013   BMI (body mass index), pediatric, 5% to less than 85% for age 20/24/2014   Acne 08/18/2013   History reviewed. No pertinent past medical history.  History reviewed. No pertinent family history.  History reviewed. No pertinent surgical history. Social History   Occupational History   Not on file  Tobacco Use   Smoking status: Some Days    Types: Cigars   Smokeless tobacco: Never   Tobacco comments:    1-2 cigars  Vaping Use   Vaping Use: Never used  Substance and Sexual Activity   Alcohol use: No   Drug use: No   Sexual activity: Not on file

## 2023-01-25 NOTE — Patient Instructions (Signed)
Frederick Stewart  01/25/2023     @[email or web address] @   Your procedure is scheduled on  01/27/2023.   Report to [facility] at  0600  A.M.   Call this number if you have problems the morning of surgery:  [phone]  If you experience any cold or flu symptoms such as cough, fever, chills, shortness of breath, etc. between now and your scheduled surgery, please notify us at the above number.   Remember:  Do not eat or drink after midnight.      Take these medicines the morning of surgery with A SIP OF WATER                                            None.    Do not wear jewelry, make-up or nail polish.  Do not wear lotions, powders, or perfumes, or deodorant.  Do not shave 48 hours prior to surgery.  Men may shave face and neck.  Do not bring valuables to the hospital.  [facility] is not responsible for any belongings or valuables.  Contacts, dentures or bridgework may not be worn into surgery.  Leave your suitcase in the car.  After surgery it may be brought to your room.  For patients admitted to the hospital, discharge time will be determined by your treatment team.  Patients discharged the day of surgery will not be allowed to drive home and must have someone with them for 24 hours.    Special instructions:   DO NOT smoke tobacco or vape for 24 hours before your procedure.  Please read over the following fact sheets that you were given. Coughing and Deep Breathing, Surgical Site Infection Prevention, Anesthesia Post-op Instructions, and Care and Recovery After Surgery      Incision Care, Adult An incision is a cut that a doctor makes in your skin for surgery. Most times, these cuts are closed after surgery. Your cut from surgery may be closed with: Stitches (sutures). Staples. Skin glue. Skin tape (adhesive) strips. You may need to go back to your doctor to have stitches or staples taken out. This may happen many days or many weeks after your surgery.  You need to take good care of your cut so it does not get infected. Follow instructions from your doctor about how to care for your cut. Supplies needed: Soap and water. A clean hand towel. Wound cleanser. A clean bandage (dressing), if needed. Cream or ointment, if told by your doctor. Clean gauze. How to care for your cut from surgery Cleaning your cut Ask your doctor how to clean your cut. You may need to: Wear medical gloves. Use mild soap and water, or a wound cleanser. Use a clean gauze to pat your cut dry after you clean it. Changing your bandage Wash your hands with soap and water for at least 20 seconds before and after you change your bandage. If you cannot use soap and water, use hand sanitizer. Do not usedisinfectants or antiseptics, such as rubbing alcohol, to clean your wound unless told by your doctor. Change your bandage as told by your doctor. Leavestitches or skin glue in place for at least 2 weeks. Leave tape strips alone unless you are told to take them off. You may trim the edges of the tape strips if they curl up. Put  a cream or ointment on your cut. Do this only as told. Cover your cut with a clean bandage. Ask your doctor when you can leave your cut uncovered. Checking for infection Check your cut area every day for signs of infection. Check for: More redness, swelling, or pain. More fluid or blood. New warmth. Hardness or a new rash around the incision. Pus or a bad smell.  Follow these instructions at home Medicines Take over-the-counter and prescription medicines only as told by your doctor. If you were prescribed an antibiotic medicine, cream, or ointment, use it as told by your doctor. Do not stop using the antibiotic even if you start to feel better. Eating and drinking Eat foods that have a lot of certain nutrients, such as protein, vitamin A, and vitamin C. These foods help your cut heal. Foods rich in protein include meat, fish, eggs, dairy,  beans, nuts, and protein drinks. Foods rich in vitamin A include carrots and dark green, leafy vegetables. Foods rich in vitamin C include citrus fruits, tomatoes, broccoli, and peppers. Drink enough fluid to keep your pee (urine) pale yellow. General instructions  Do not take baths, swim, or use a hot tub. Ask your doctor about taking showers or sponge baths. Limit movement around your cut. This helps with healing. Try not to strain, lift, or exercise for the first 2 weeks, or for as long as told by your doctor. Return to your normal activities as told by your doctor. Ask your doctor what activities are safe for you. Do not scratch, scrub, or pick at your cut. Keep it covered as told by your doctor. Protect your cut from the sun when you are outside for the first 6 months, or for as long as told by your doctor. Cover up the scar area or put on sunscreen that has an SPF of at least 30. Do not use any products that contain nicotine or tobacco, such as cigarettes, e-cigarettes, and chewing tobacco. These can delay cut healing. If you need help quitting, ask your doctor. Keep all follow-up visits. Contact a doctor if: You have any of these signs of infection around your cut: More redness, swelling, or pain. More fluid or blood. New warmth or hardness. Pus or a bad smell. A new rash. You have a fever. You feel like you may vomit (nauseous). You vomit. You are dizzy. Your stitches, staples, skin glue, or tape strips come undone. Your cut gets bigger. You have a fever. Get help right away if: Your cut bleeds through your bandage, and bleeding does not stop with gentle pressure. Your cut opens up and comes apart. These symptoms may be an emergency. Do not wait to see if the symptoms will go away. Get medical help right away. Call your local emergency services (911 in the [place] Do not drive yourself to the hospital. Summary Follow instructions from your doctor about how to care for your  cut. Wash your hands with soap and water for at least 20 seconds before and after you change your bandage. If you cannot use soap and water, use hand sanitizer. Check your cut area every day for signs of infection. Keep all follow-up visits. This information is not intended to replace advice given to you by your health care provider. Make sure you discuss any questions you have with your health care provider. Document Revised: 01/13/2021 Document Reviewed: 01/13/2021 Elsevier Patient Education  [place]. Patellar Fracture, Adult  A patellar fracture is a break in the kneecap.  The kneecap is also called a patella. The patella is located on the front of the knee. A patellar fracture may make it difficult to walk or do other activities. What are the causes? This condition may be caused by: A fall. A hard and direct hit to the knee. A very hard and strong bending of the knee. Overuse of the knee due to repeated movements. What increases the risk? The following factors may make you more likely to develop this condition: Playing contact sports or motor sports, especially sports that involve a lot of jumping. Having bone problems or diseases of the bone, such as a condition that weakens the bones (osteoporosis) or a bone tumor. Having poor strength or flexibility. Having metabolism disorders, hormone problems, or nutrition disorders. What are the signs or symptoms? Symptoms of this condition include: A tender and swollen knee. Pain when moving your knee, especially when straightening it. Difficulty walking or using your knee to support body weight. A misshapen knee. It may look like a bone in your knee is out of place. Hearing a noise, like a pop or a snap, in the front of your knee at the time of injury. Having a feeling like a pop or a snap in the front of your knee at the time of injury. How is this diagnosed? This condition may be diagnosed based on your symptoms and medical  history, a physical exam, and X-rays. How is this treated? Treatment for this condition depends on the type of fracture that you have. If your patella is still in the right position after the fracture and you can still straighten your leg, you may need to wear a brace or cast for 4-6 weeks. If your patella is broken into multiple pieces but you are able to straighten your leg, you may be treated with: A brace or cast for 4-6 weeks. In rare cases, the patella may need to be removed before the cast is applied. Surgery. Surgery may be done to place wires, screws, or plates to hold the bones together while they heal. If you cannot straighten your leg after a patellar fracture, you will have surgery to hold the patella together until it heals. After surgery, a brace or cast will be applied for 4-6 weeks. You may be prescribed medicine to help relieve pain. Follow these instructions at home: Medicines Take over-the-counter and prescription medicines only as told by your health care provider. Ask your health care provider if the medicine prescribed to you: Requires you to avoid driving or using machinery. Can cause constipation. You may need to take these actions to prevent or treat constipation: Drink enough fluid to keep your urine pale yellow. Take over-the-counter or prescription medicines. Eat foods that are high in fiber, such as beans, whole grains, and fresh fruits and vegetables. Limit foods that are high in fat and processed sugars, such as fried or sweet foods. If you have a removable brace: Wear the brace as told by your health care provider. Remove it only as told by your health care provider. Check the skin around the brace every day. Tell your health care provider about any concerns. Loosen the brace if your toes tingle, become numb, or turn cold and blue. Keep the brace clean and dry. If you have a nonremovable cast: Do not put pressure on any part of the cast until it is fully  hardened. This may take several hours. Do not stick anything inside the cast to scratch your skin. Doing that increases  your risk of infection. Check the skin around the cast every day. Tell your health care provider about any concerns. You may put lotion on dry skin around the edges of the cast. Do not put lotion on the skin underneath the cast. Keep the cast clean and dry. Bathing Do not take baths, swim, or use a hot tub until your health care provider approves. Ask your health care provider if you may take showers. If the cast or brace is not waterproof: Do not let it get wet. Cover it with a watertight covering when you take a bath or a shower. Managing pain, stiffness, and swelling  If directed, put ice on the injured area. To do this: If you have a removable brace, remove it as told by your health care provider. Put ice in a plastic bag. Place a towel between your skin and the bag or between your cast and the bag. Leave the ice on for 20 minutes, 2-3 times a day. Remove the ice if your skin turns bright red. This is very important. If you cannot feel pain, heat, or cold, you have a greater risk of damage to the area. Move your toes and ankle often to reduce stiffness and swelling. Raise (elevate) the injured area above the level of your heart while you are sitting or lying down. Activity Do not use the injured limb to support your body weight until your health care provider says that you can. Use crutches or a walker as told by your health care provider. Do exercises as told by your health care provider. Ask your health care provider when it is safe to drive if you have a brace or cast on your leg. Return to your normal activities as told by your health care provider. Ask your health care provider what activities are safe for you. General instructions Do not use any products that contain nicotine or tobacco. These products include cigarettes, chewing tobacco, and vaping devices, such  as e-cigarettes. These can delay bone healing. If you need help quitting, ask your health care provider. Keep all follow-up visits. This is important. Contact a health care provider if: You have a fever. You have symptoms that get worse or do not get better after 2 weeks of treatment. You have redness, more swelling, or increasing pain in your knee. Get help right away if: You have severe pain that does not go away. You have blue or gray skin below the fracture site, or your toes on the affected side look blue or gray. You have numbness or loss of feeling below the fracture site. Summary A patellar fracture is a break in the kneecap. Depending on the type of fracture, you may need to wear a brace or cast, or you may need surgery. You will need to have surgery if you are unable to straighten your leg. Return to your normal activities as told by your health care provider. Ask your health care provider what activities are safe for you. This information is not intended to replace advice given to you by your health care provider. Make sure you discuss any questions you have with your health care provider. Document Revised: 05/20/2021 Document Reviewed: 05/20/2021 Elsevier Patient Education  [place] Anesthesia, Adult, Care After The following information offers guidance on how to care for yourself after your procedure. Your health care provider may also give you more specific instructions. If you have problems or questions, contact your health care provider. What can I expect after  the procedure? After the procedure, it is common for people to: Have pain or discomfort at the IV site. Have nausea or vomiting. Have a sore throat or hoarseness. Have trouble concentrating. Feel cold or chills. Feel weak, sleepy, or tired (fatigue). Have soreness and body aches. These can affect parts of the body that were not involved in surgery. Follow these instructions at home: For the time  period you were told by your health care provider:  Rest. Do not participate in activities where you could fall or become injured. Do not drive or use machinery. Do not drink alcohol. Do not take sleeping pills or medicines that cause drowsiness. Do not make important decisions or sign legal documents. Do not take care of children on your own. General instructions Drink enough fluid to keep your urine pale yellow. If you have sleep apnea, surgery and certain medicines can increase your risk for breathing problems. Follow instructions from your health care provider about wearing your sleep device: Anytime you are sleeping, including during daytime naps. While taking prescription pain medicines, sleeping medicines, or medicines that make you drowsy. Return to your normal activities as told by your health care provider. Ask your health care provider what activities are safe for you. Take over-the-counter and prescription medicines only as told by your health care provider. Do not use any products that contain nicotine or tobacco. These products include cigarettes, chewing tobacco, and vaping devices, such as e-cigarettes. These can delay incision healing after surgery. If you need help quitting, ask your health care provider. Contact a health care provider if: You have nausea or vomiting that does not get better with medicine. You vomit every time you eat or drink. You have pain that does not get better with medicine. You cannot urinate or have bloody urine. You develop a skin rash. You have a fever. Get help right away if: You have trouble breathing. You have chest pain. You vomit blood. These symptoms may be an emergency. Get help right away. Call 911. Do not wait to see if the symptoms will go away. Do not drive yourself to the hospital. Summary After the procedure, it is common to have a sore throat, hoarseness, nausea, vomiting, or to feel weak, sleepy, or fatigue. For the time  period you were told by your health care provider, do not drive or use machinery. Get help right away if you have difficulty breathing, have chest pain, or vomit blood. These symptoms may be an emergency. This information is not intended to replace advice given to you by your health care provider. Make sure you discuss any questions you have with your health care provider. Document Revised: 01/09/2022 Document Reviewed: 01/09/2022 Elsevier Patient Education  [place]. How to Use Chlorhexidine Before Surgery Chlorhexidine gluconate (CHG) is a germ-killing (antiseptic) solution that is used to clean the skin. It can get rid of the bacteria that normally live on the skin and can keep them away for about 24 hours. To clean your skin with CHG, you may be given: A CHG solution to use in the shower or as part of a sponge bath. A prepackaged cloth that contains CHG. Cleaning your skin with CHG may help lower the risk for infection: While you are staying in the intensive care unit of the hospital. If you have a vascular access, such as a central line, to provide short-term or long-term access to your veins. If you have a catheter to drain urine from your bladder. If you are on  a ventilator. A ventilator is a machine that helps you breathe by moving air in and out of your lungs. After surgery. What are the risks? Risks of using CHG include: A skin reaction. Hearing loss, if CHG gets in your ears and you have a perforated eardrum. Eye injury, if CHG gets in your eyes and is not rinsed out. The CHG product catching fire. Make sure that you avoid smoking and flames after applying CHG to your skin. Do not use CHG: If you have a chlorhexidine allergy or have previously reacted to chlorhexidine. On babies younger than 29 months of age. How to use CHG solution Use CHG only as told by your health care provider, and follow the instructions on the label. Use the full amount of CHG as directed.  Usually, this is one bottle. During a shower Follow these steps when using CHG solution during a shower (unless your health care provider gives you different instructions): Start the shower. Use your normal soap and shampoo to wash your face and hair. Turn off the shower or move out of the shower stream. Pour the CHG onto a clean washcloth. Do not use any type of brush or rough-edged sponge. Starting at your neck, lather your body down to your toes. Make sure you follow these instructions: If you will be having surgery, pay special attention to the part of your body where you will be having surgery. Scrub this area for at least 1 minute. Do not use CHG on your head or face. If the solution gets into your ears or eyes, rinse them well with water. Avoid your genital area. Avoid any areas of skin that have broken skin, cuts, or scrapes. Scrub your back and under your arms. Make sure to wash skin folds. Let the lather sit on your skin for 1-2 minutes or as long as told by your health care provider. Thoroughly rinse your entire body in the shower. Make sure that all body creases and crevices are rinsed well. Dry off with a clean towel. Do not put any substances on your body afterward--such as powder, lotion, or perfume--unless you are told to do so by your health care provider. Only use lotions that are recommended by the manufacturer. Put on clean clothes or pajamas. If it is the night before your surgery, sleep in clean sheets.  During a sponge bath Follow these steps when using CHG solution during a sponge bath (unless your health care provider gives you different instructions): Use your normal soap and shampoo to wash your face and hair. Pour the CHG onto a clean washcloth. Starting at your neck, lather your body down to your toes. Make sure you follow these instructions: If you will be having surgery, pay special attention to the part of your body where you will be having surgery. Scrub this  area for at least 1 minute. Do not use CHG on your head or face. If the solution gets into your ears or eyes, rinse them well with water. Avoid your genital area. Avoid any areas of skin that have broken skin, cuts, or scrapes. Scrub your back and under your arms. Make sure to wash skin folds. Let the lather sit on your skin for 1-2 minutes or as long as told by your health care provider. Using a different clean, wet washcloth, thoroughly rinse your entire body. Make sure that all body creases and crevices are rinsed well. Dry off with a clean towel. Do not put any substances on your body afterward--such  as powder, lotion, or perfume--unless you are told to do so by your health care provider. Only use lotions that are recommended by the manufacturer. Put on clean clothes or pajamas. If it is the night before your surgery, sleep in clean sheets. How to use CHG prepackaged cloths Only use CHG cloths as told by your health care provider, and follow the instructions on the label. Use the CHG cloth on clean, dry skin. Do not use the CHG cloth on your head or face unless your health care provider tells you to. When washing with the CHG cloth: Avoid your genital area. Avoid any areas of skin that have broken skin, cuts, or scrapes. Before surgery Follow these steps when using a CHG cloth to clean before surgery (unless your health care provider gives you different instructions): Using the CHG cloth, vigorously scrub the part of your body where you will be having surgery. Scrub using a back-and-forth motion for 3 minutes. The area on your body should be completely wet with CHG when you are done scrubbing. Do not rinse. Discard the cloth and let the area air-dry. Do not put any substances on the area afterward, such as powder, lotion, or perfume. Put on clean clothes or pajamas. If it is the night before your surgery, sleep in clean sheets.  For general bathing Follow these steps when using CHG  cloths for general bathing (unless your health care provider gives you different instructions). Use a separate CHG cloth for each area of your body. Make sure you wash between any folds of skin and between your fingers and toes. Wash your body in the following order, switching to a new cloth after each step: The front of your neck, shoulders, and chest. Both of your arms, under your arms, and your hands. Your stomach and groin area, avoiding the genitals. Your right leg and foot. Your left leg and foot. The back of your neck, your back, and your buttocks. Do not rinse. Discard the cloth and let the area air-dry. Do not put any substances on your body afterward--such as powder, lotion, or perfume--unless you are told to do so by your health care provider. Only use lotions that are recommended by the manufacturer. Put on clean clothes or pajamas. Contact a health care provider if: Your skin gets irritated after scrubbing. You have questions about using your solution or cloth. You swallow any chlorhexidine. Call your local poison control center (1-903-124-2635 in the [place] Get help right away if: Your eyes itch badly, or they become very red or swollen. Your skin itches badly and is red or swollen. Your hearing changes. You have trouble seeing. You have swelling or tingling in your mouth or throat. You have trouble breathing. These symptoms may represent a serious problem that is an emergency. Do not wait to see if the symptoms will go away. Get medical help right away. Call your local emergency services (911 in the [place] Do not drive yourself to the hospital. Summary Chlorhexidine gluconate (CHG) is a germ-killing (antiseptic) solution that is used to clean the skin. Cleaning your skin with CHG may help to lower your risk for infection. You may be given CHG to use for bathing. It may be in a bottle or in a prepackaged cloth to use on your skin. Carefully follow your health care provider's  instructions and the instructions on the product label. Do not use CHG if you have a chlorhexidine allergy. Contact your health care provider if your skin gets irritated after  scrubbing. This information is not intended to replace advice given to you by your health care provider. Make sure you discuss any questions you have with your health care provider. Document Revised: 02/09/2022 Document Reviewed: 12/23/2020 Elsevier Patient Education  [place].

## 2023-01-26 ENCOUNTER — Encounter (HOSPITAL_COMMUNITY)
Admission: RE | Admit: 2023-01-26 | Discharge: 2023-01-26 | Disposition: A | Discharge status: Home / Self Care | Payer: Medicaid Other | Source: Ambulatory Visit | Attending: Orthopedic Surgery | Admitting: Orthopedic Surgery

## 2023-01-26 ENCOUNTER — Encounter (HOSPITAL_COMMUNITY): Payer: Self-pay

## 2023-01-26 DIAGNOSIS — X58XXXA Exposure to other specified factors, initial encounter: Secondary | ICD-10-CM | POA: Diagnosis not present

## 2023-01-26 DIAGNOSIS — S86812A Strain of other muscle(s) and tendon(s) at lower leg level, left leg, initial encounter: Secondary | ICD-10-CM | POA: Insufficient documentation

## 2023-01-26 DIAGNOSIS — S82035A Nondisplaced transverse fracture of left patella, initial encounter for closed fracture: Secondary | ICD-10-CM | POA: Diagnosis not present

## 2023-01-26 DIAGNOSIS — Z01818 Encounter for other preprocedural examination: Secondary | ICD-10-CM

## 2023-01-26 DIAGNOSIS — Z01812 Encounter for preprocedural laboratory examination: Secondary | ICD-10-CM | POA: Insufficient documentation

## 2023-01-26 LAB — BASIC METABOLIC PANEL
Anion gap: 10 (ref 5–15)
BUN: 16 mg/dL (ref 6–20)
CO2: 23 mmol/L (ref 22–32)
Calcium: 9.2 mg/dL (ref 8.9–10.3)
Chloride: 103 mmol/L (ref 98–111)
Creatinine, Ser: 1.19 mg/dL (ref 0.61–1.24)
GFR, Estimated: >60 mL/min (ref >60)
Glucose, Bld: 92 mg/dL (ref 70–99)
Potassium: 3.7 mmol/L (ref 3.5–5.1)
Sodium: 136 mmol/L (ref 135–145)

## 2023-01-26 LAB — CBC WITH DIFFERENTIAL/PLATELET
Abs Immature Granulocytes: 0.02 10*3/uL (ref 0.00–0.07)
Basophils Absolute: 0 10*3/uL (ref 0.0–0.1)
Basophils Relative: 1 %
Eosinophils Absolute: 0.3 10*3/uL (ref 0.0–0.5)
Eosinophils Relative: 5 %
HCT: 46.9 % (ref 39.0–52.0)
Hemoglobin: 16.2 g/dL (ref 13.0–17.0)
Immature Granulocytes: 0 %
Lymphocytes Relative: 29 %
Lymphs Abs: 1.8 10*3/uL (ref 0.7–4.0)
MCH: 29.8 pg (ref 26.0–34.0)
MCHC: 34.5 g/dL (ref 30.0–36.0)
MCV: 86.2 fL (ref 80.0–100.0)
Monocytes Absolute: 0.4 10*3/uL (ref 0.1–1.0)
Monocytes Relative: 7 %
Neutro Abs: 3.6 10*3/uL (ref 1.7–7.7)
Neutrophils Relative %: 58 %
Platelets: 219 10*3/uL (ref 150–400)
RBC: 5.44 MIL/uL (ref 4.22–5.81)
RDW: 12.4 % (ref 11.5–15.5)
WBC: 6.1 10*3/uL (ref 4.0–10.5)
nRBC: 0 % (ref 0.0–0.2)

## 2023-01-27 ENCOUNTER — Ambulatory Visit (HOSPITAL_COMMUNITY): Payer: Medicaid Other

## 2023-01-27 ENCOUNTER — Other Ambulatory Visit: Payer: Self-pay

## 2023-01-27 ENCOUNTER — Encounter (HOSPITAL_COMMUNITY): Payer: Self-pay | Admitting: Orthopedic Surgery

## 2023-01-27 ENCOUNTER — Ambulatory Visit (HOSPITAL_COMMUNITY)
Admission: RE | Admit: 2023-01-27 | Discharge: 2023-01-27 | Disposition: A | Discharge status: Home / Self Care | Payer: Medicaid Other | Source: Ambulatory Visit | Attending: Orthopedic Surgery | Admitting: Orthopedic Surgery

## 2023-01-27 ENCOUNTER — Encounter (HOSPITAL_COMMUNITY)
Admission: RE | Disposition: A | Discharge status: Home / Self Care | Payer: Self-pay | Source: Ambulatory Visit | Attending: Orthopedic Surgery

## 2023-01-27 ENCOUNTER — Ambulatory Visit (HOSPITAL_COMMUNITY): Payer: Medicaid Other | Admitting: Certified Registered"

## 2023-01-27 ENCOUNTER — Ambulatory Visit (HOSPITAL_BASED_OUTPATIENT_CLINIC_OR_DEPARTMENT_OTHER): Payer: Medicaid Other | Admitting: Certified Registered"

## 2023-01-27 DIAGNOSIS — Y9367 Activity, basketball: Secondary | ICD-10-CM | POA: Diagnosis not present

## 2023-01-27 DIAGNOSIS — S86812A Strain of other muscle(s) and tendon(s) at lower leg level, left leg, initial encounter: Secondary | ICD-10-CM

## 2023-01-27 DIAGNOSIS — F17290 Nicotine dependence, other tobacco product, uncomplicated: Secondary | ICD-10-CM | POA: Insufficient documentation

## 2023-01-27 DIAGNOSIS — X58XXXA Exposure to other specified factors, initial encounter: Secondary | ICD-10-CM | POA: Diagnosis not present

## 2023-01-27 DIAGNOSIS — S82035A Nondisplaced transverse fracture of left patella, initial encounter for closed fracture: Secondary | ICD-10-CM | POA: Diagnosis not present

## 2023-01-27 DIAGNOSIS — Z4789 Encounter for other orthopedic aftercare: Secondary | ICD-10-CM | POA: Diagnosis not present

## 2023-01-27 DIAGNOSIS — S82002A Unspecified fracture of left patella, initial encounter for closed fracture: Secondary | ICD-10-CM

## 2023-01-27 HISTORY — PX: ORIF PATELLA: SHX5033

## 2023-01-27 SURGERY — OPEN REDUCTION INTERNAL FIXATION (ORIF) PATELLA
Anesthesia: General | Site: Knee | Laterality: Left

## 2023-01-27 MED ORDER — BUPIVACAINE-EPINEPHRINE (PF) 0.5% -1:200000 IJ SOLN
INTRAMUSCULAR | Status: DC | PRN
  Administered 2023-01-27: 30 mL via PERINEURAL

## 2023-01-27 MED ORDER — HYDROMORPHONE HCL 1 MG/ML IJ SOLN
INTRAMUSCULAR | Status: AC
  Filled 2023-01-27: qty 0.5

## 2023-01-27 MED ORDER — ONDANSETRON HCL 4 MG/2ML IJ SOLN
INTRAMUSCULAR | Status: DC | PRN
  Administered 2023-01-27: 4 mg via INTRAVENOUS

## 2023-01-27 MED ORDER — PROPOFOL 10 MG/ML IV BOLUS
INTRAVENOUS | Status: AC
  Filled 2023-01-27: qty 20

## 2023-01-27 MED ORDER — HYDROMORPHONE HCL 1 MG/ML IJ SOLN
INTRAMUSCULAR | Status: DC | PRN
  Administered 2023-01-27: .5 mg via INTRAVENOUS

## 2023-01-27 MED ORDER — OXYCODONE HCL 5 MG PO TABS
5.0000 mg | ORAL_TABLET | Freq: Once | ORAL | Status: AC | PRN
  Administered 2023-01-27: 5 mg via ORAL
  Filled 2023-01-27: qty 1

## 2023-01-27 MED ORDER — LACTATED RINGERS IV SOLN: INTRAVENOUS | Status: DC

## 2023-01-27 MED ORDER — SODIUM CHLORIDE 0.9 % IR SOLN
Status: DC | PRN
  Administered 2023-01-27: 1000 mL

## 2023-01-27 MED ORDER — MIDAZOLAM HCL 2 MG/2ML IJ SOLN
INTRAMUSCULAR | Status: AC
  Filled 2023-01-27: qty 2

## 2023-01-27 MED ORDER — FENTANYL CITRATE (PF) 250 MCG/5ML IJ SOLN
INTRAMUSCULAR | Status: DC | PRN
  Administered 2023-01-27 (×2): 50 ug via INTRAVENOUS
  Administered 2023-01-27 (×2): 25 ug via INTRAVENOUS

## 2023-01-27 MED ORDER — KETOROLAC TROMETHAMINE 30 MG/ML IJ SOLN
30.0000 mg | Freq: Once | INTRAMUSCULAR | Status: AC
  Administered 2023-01-27: 30 mg via INTRAVENOUS
  Filled 2023-01-27: qty 1

## 2023-01-27 MED ORDER — DEXAMETHASONE SODIUM PHOSPHATE 10 MG/ML IJ SOLN: 8.0000 mg | Freq: Once | INTRAMUSCULAR | Status: DC | PRN

## 2023-01-27 MED ORDER — HYDROCODONE-ACETAMINOPHEN 5-325 MG PO TABS
2.0000 | ORAL_TABLET | Freq: Once | ORAL | Status: AC
  Administered 2023-01-27: 2 via ORAL
  Filled 2023-01-27: qty 2

## 2023-01-27 MED ORDER — BUPIVACAINE-EPINEPHRINE (PF) 0.5% -1:200000 IJ SOLN
INTRAMUSCULAR | Status: AC
  Filled 2023-01-27: qty 30

## 2023-01-27 MED ORDER — ROPIVACAINE HCL 5 MG/ML IJ SOLN
INTRAMUSCULAR | Status: AC
  Filled 2023-01-27: qty 30

## 2023-01-27 MED ORDER — ORAL CARE MOUTH RINSE: 15.0000 mL | Freq: Once | OROMUCOSAL | Status: AC

## 2023-01-27 MED ORDER — IBUPROFEN 800 MG PO TABS: 800.0000 mg | ORAL_TABLET | Freq: Three times a day (TID) | ORAL | 1 refills | Status: AC | PRN

## 2023-01-27 MED ORDER — LACTATED RINGERS IV SOLN: INTRAVENOUS | Status: DC | PRN

## 2023-01-27 MED ORDER — HYDROCODONE-ACETAMINOPHEN 10-325 MG PO TABS: 1.0000 | ORAL_TABLET | ORAL | 0 refills | Status: DC | PRN

## 2023-01-27 MED ORDER — CEFAZOLIN SODIUM-DEXTROSE 2-4 GM/100ML-% IV SOLN
2.0000 g | INTRAVENOUS | Status: AC
  Administered 2023-01-27: 2 g via INTRAVENOUS
  Filled 2023-01-27: qty 100

## 2023-01-27 MED ORDER — CHLORHEXIDINE GLUCONATE 0.12 % MT SOLN
15.0000 mL | Freq: Once | OROMUCOSAL | Status: AC
  Administered 2023-01-27: 15 mL via OROMUCOSAL

## 2023-01-27 MED ORDER — LIDOCAINE HCL (CARDIAC) PF 100 MG/5ML IV SOSY
PREFILLED_SYRINGE | INTRAVENOUS | Status: DC | PRN
  Administered 2023-01-27: 40 mg via INTRAVENOUS

## 2023-01-27 MED ORDER — OXYCODONE HCL 5 MG/5ML PO SOLN: 5.0000 mg | Freq: Once | ORAL | Status: AC | PRN

## 2023-01-27 MED ORDER — DEXMEDETOMIDINE HCL IN NACL 80 MCG/20ML IV SOLN
INTRAVENOUS | Status: DC | PRN
  Administered 2023-01-27 (×3): 4 ug via BUCCAL

## 2023-01-27 MED ORDER — FENTANYL CITRATE (PF) 250 MCG/5ML IJ SOLN
INTRAMUSCULAR | Status: AC
  Filled 2023-01-27: qty 5

## 2023-01-27 MED ORDER — PROPOFOL 10 MG/ML IV BOLUS
INTRAVENOUS | Status: DC | PRN
  Administered 2023-01-27: 200 mg via INTRAVENOUS

## 2023-01-27 MED ORDER — FENTANYL CITRATE PF 50 MCG/ML IJ SOSY
25.0000 ug | PREFILLED_SYRINGE | INTRAMUSCULAR | Status: DC | PRN
  Administered 2023-01-27: 50 ug via INTRAVENOUS
  Filled 2023-01-27: qty 1

## 2023-01-27 MED ORDER — MIDAZOLAM HCL 2 MG/2ML IJ SOLN
INTRAMUSCULAR | Status: DC | PRN
  Administered 2023-01-27: 2 mg via INTRAVENOUS

## 2023-01-27 SURGICAL SUPPLY — 58 items
ANCH SUT CRKSW FT 1.3X (Anchor) ×1 IMPLANT
ANCHOR SUT BIOCOMP CORKSREW (Anchor) IMPLANT
APL PRP STRL LF DISP 70% ISPRP (MISCELLANEOUS) ×1
BANDAGE ESMARK 6X9 LF (GAUZE/BANDAGES/DRESSINGS) ×1 IMPLANT
BIT DRILL 2.4X128 (BIT) IMPLANT
BIT DRILL CANN 2.4MM4.5MM NSTR (DRILL) IMPLANT
BLADE SURG SZ10 CARB STEEL (BLADE) ×1 IMPLANT
BNDG CMPR 9X6 STRL LF SNTH (GAUZE/BANDAGES/DRESSINGS) ×1
BNDG CMPR STD VLCR NS LF 5.8X6 (GAUZE/BANDAGES/DRESSINGS) ×1
BNDG COHESIVE 4X5 TAN STRL (GAUZE/BANDAGES/DRESSINGS) ×1 IMPLANT
BNDG ELASTIC 6X5.8 VLCR NS LF (GAUZE/BANDAGES/DRESSINGS) ×1 IMPLANT
BNDG ESMARK 6X9 LF (GAUZE/BANDAGES/DRESSINGS) ×1
CHLORAPREP W/TINT 26 (MISCELLANEOUS) ×1 IMPLANT
CLOTH BEACON ORANGE TIMEOUT ST (SAFETY) ×1 IMPLANT
COVER LIGHT HANDLE STERIS (MISCELLANEOUS) ×2 IMPLANT
CUFF TOURN SGL QUICK 34 (TOURNIQUET CUFF) ×1
CUFF TRNQT CYL 34X4.125X (TOURNIQUET CUFF) IMPLANT
DECANTER SPIKE VIAL GLASS SM (MISCELLANEOUS) ×1 IMPLANT
DRAPE C-ARM FOLDED MOBILE STRL (DRAPES) ×1 IMPLANT
DRAPE HALF SHEET 40X57 (DRAPES) ×2 IMPLANT
DRAPE INCISE IOBAN 66X45 STRL (DRAPES) ×1 IMPLANT
DRILL CANN 2.4MM 4.5MM  NONSTR (DRILL) ×1
DRILL CANN 2.4MM 4.5MM NONSTR (DRILL) ×1
ELECT REM PT RETURN 9FT ADLT (ELECTROSURGICAL) ×1
ELECTRODE REM PT RTRN 9FT ADLT (ELECTROSURGICAL) ×1 IMPLANT
GAUZE SPONGE 4X4 12PLY STRL (GAUZE/BANDAGES/DRESSINGS) IMPLANT
GAUZE XEROFORM 1X8 LF (GAUZE/BANDAGES/DRESSINGS) IMPLANT
GLOVE BIOGEL PI IND STRL 7.0 (GLOVE) ×2 IMPLANT
GLOVE SS N UNI LF 8.5 STRL (GLOVE) ×1 IMPLANT
GLOVE SURG POLYISO LF SZ8 (GLOVE) ×1 IMPLANT
GOWN STRL REUS W/TWL LRG LVL3 (GOWN DISPOSABLE) ×2 IMPLANT
GOWN STRL REUS W/TWL XL LVL3 (GOWN DISPOSABLE) ×1 IMPLANT
IMMOBILIZER KNEE 19 UNV (ORTHOPEDIC SUPPLIES) ×1 IMPLANT
INST SET MINOR BONE (KITS) ×1 IMPLANT
KIT TURNOVER KIT A (KITS) ×1 IMPLANT
MANIFOLD NEPTUNE II (INSTRUMENTS) ×1 IMPLANT
NDL HYPO 21X1.5 SAFETY (NEEDLE) ×1 IMPLANT
NDL MAYO 1/2 CRC TROCAR PT (NEEDLE) IMPLANT
NEEDLE HYPO 21X1.5 SAFETY (NEEDLE) ×1 IMPLANT
NEEDLE MAYO 1/2 CRC TROCAR PT (NEEDLE) ×1 IMPLANT
NS IRRIG 1000ML POUR BTL (IV SOLUTION) ×1 IMPLANT
PACK BASIC LIMB (CUSTOM PROCEDURE TRAY) ×1 IMPLANT
PAD ABD 5X9 TENDERSORB (GAUZE/BANDAGES/DRESSINGS) IMPLANT
PAD ARMBOARD 7.5X6 YLW CONV (MISCELLANEOUS) ×1 IMPLANT
PAD CAST 4YDX4 CTTN HI CHSV (CAST SUPPLIES) IMPLANT
PADDING CAST COTTON 4X4 STRL (CAST SUPPLIES) ×1
SET BASIN LINEN APH (SET/KITS/TRAYS/PACK) ×1 IMPLANT
SPONGE T-LAP 18X18 ~~LOC~~+RFID (SPONGE) ×1 IMPLANT
STRIP CLOSURE SKIN 1/2X4 (GAUZE/BANDAGES/DRESSINGS) IMPLANT
SUT BRALON NAB BRD #1 30IN (SUTURE) IMPLANT
SUT ETHIBOND 5 LR DA (SUTURE) ×1 IMPLANT
SUT MON AB 0 CT1 (SUTURE) ×1 IMPLANT
SUT MON AB 2-0 CT1 36 (SUTURE) IMPLANT
SUT VIC AB 1 CT1 27 (SUTURE) ×1
SUT VIC AB 1 CT1 27XBRD ANTBC (SUTURE) ×1 IMPLANT
SYR 20ML LL LF (SYRINGE) ×2 IMPLANT
SYR BULB IRRIG 60ML STRL (SYRINGE) ×1 IMPLANT
TOWEL OR 17X26 4PK STRL BLUE (TOWEL DISPOSABLE) ×1 IMPLANT

## 2023-01-27 NOTE — Anesthesia Preprocedure Evaluation (Signed)
Anesthesia Evaluation  Patient identified by MRN, date of birth, ID band Patient awake    Reviewed: Allergy & Precautions, H&P , NPO status , Patient's Chart, lab work & pertinent test results, reviewed documented beta blocker date and time   Airway Mallampati: II  TM Distance: >3 FB Neck ROM: full    Dental no notable dental hx.    Pulmonary neg pulmonary ROS, Current Smoker and Patient abstained from smoking.   Pulmonary exam normal breath sounds clear to auscultation       Cardiovascular Exercise Tolerance: Good negative cardio ROS  Rhythm:regular Rate:Normal     Neuro/Psych negative neurological ROS  negative psych ROS   GI/Hepatic negative GI ROS, Neg liver ROS,,,  Endo/Other  negative endocrine ROS    Renal/GU negative Renal ROS  negative genitourinary   Musculoskeletal   Abdominal   Peds  Hematology negative hematology ROS (+)   Anesthesia Other Findings   Reproductive/Obstetrics negative OB ROS                             Anesthesia Physical Anesthesia Plan  ASA: 1  Anesthesia Plan: General and General LMA   Post-op Pain Management:    Induction:   PONV Risk Score and Plan: Ondansetron  Airway Management Planned:   Additional Equipment:   Intra-op Plan:   Post-operative Plan:   Informed Consent: I have reviewed the patients History and Physical, chart, labs and discussed the procedure including the risks, benefits and alternatives for the proposed anesthesia with the patient or authorized representative who has indicated his/her understanding and acceptance.     Dental Advisory Given  Plan Discussed with: CRNA  Anesthesia Plan Comments:        Anesthesia Quick Evaluation

## 2023-01-27 NOTE — Brief Op Note (Addendum)
Orthopaedic Surgery Operative Note (CSN: [id number])  Frederick Stewart  2001-09-25 Date of Surgery: 01/27/2023   Diagnoses:  Left patella avulsion fracture distal pole  Procedure: Excision of avulsed distal patella with patellar tendon repair and advancement   Operative Finding Successful completion of the planned procedure.  The distal pole the patella was fractured and loose it looked to be acute the other portions of the tendon were intact.  After the avulsed fragment was removed the remaining patellar tendon was in discontinuity with the remaining portion of the patella and required repair and advancement   Post-Op Diagnosis: Same Surgeons:Primary: Carole Civil, MD Assistants: Franklyn Lor Location: [facility] OR ROOM 4 Anesthesia: General Antibiotics: Ancef 2 g Tourniquet time:  Total Tourniquet Time Documented: Thigh (Left) - 58 minutes Total: Thigh (Left) - 58 minutes  Estimated Blood Loss: None Complications: None Specimens: None Implants: Implant Name Type Inv. Item Serial No. Manufacturer Lot No. LRB No. Used Action  ANCHOR Lottie Rater - [id number] Anchor ANCHOR [id number] Left 1 Implanted    Indications for Surgery:   Frederick Stewart is a 22 y.o. male who was playing basketball and felt a pop in his left knee when he jumped for the basketball.  He did note a 2-week prodromal.  Of pain at the inferior pole the patella.  Benefits and risks of operative and nonoperative management were discussed prior to surgery with patient/guardian(s) and informed consent form was completed.  Specific risks including infection, need for additional surgery, stiffness weakness.   Procedure:   Frederick Stewart was identified in the preop area the surgical site was confirmed and marked chart review was completed.  I checked with the operating room for equipment which was available  X-rays were reviewed  The patient was taken to the operating for general  anesthesia he was in the supine position.  We placed a tourniquet on his left thigh.  His leg was prepped and draped sterilely.  A timeout was completed.  I held the leg elevated for 3 minutes to exsanguinate prior to inflating the tourniquet to 300 mmHg  I made the incision at the inferior pole of patella approxi-3 cm.  I divided the subcutaneous tissue and split the tendon longitudinally and found the fragment which was separated from the main portion of the patella.  There was hematoma noted in the area indicating acute fracture.  I excised the fragment preserving as much tendon as possible  I placed a [employer] bio composite corkscrew anchor.  We did this in the following manner:  First I debrided the end of the patella.  I then placed a drill and the anchor site followed by over drilling with the reamer and then placed the screw.  The screw had for 4 tails on it these were passed through the tendon incorporating some of the normal tendon on each side of the defect.  Due to the sliding nature of the suture anchor this allowed advancement of the tendon back to bone  I then checked the range of motion on the knee and ranged him from 0 to 90 degrees with no tension on the repair  The wound was irrigated and closed with #1 Vicryl and 2-0 Monocryl and Steri-Strips  I injected all around the wound and patella with Marcaine with epinephrine  Sterile dressings and knee immobilizer were applied   Post-operative plan:  The patient will be WBAT on the operative extremity Knee immobilizer No shower for the  first week Follow-up in a week to check the wound and place him in a range of motion brace 0-45 for 2 weeks and then advance as tolerated up to full range of motion

## 2023-01-27 NOTE — Interval H&P Note (Signed)
History and Physical Interval Note:  01/27/2023 7:17 AM  Frederick Stewart  has presented today for surgery, with the diagnosis of Left patella fracture and patella tendon tear.  The various methods of treatment have been discussed with the patient and family. After consideration of risks, benefits and other options for treatment, the patient has consented to  Procedure(s): OPEN REDUCTION INTERNAL FIXATION (ORIF) PATELLA AND PATELLA TENDON REPAIR (Left) as a surgical intervention.  The patient's history has been reviewed, patient examined, no change in status, stable for surgery.  I have reviewed the patient's chart and labs.  Questions were answered to the patient's satisfaction.     Arther Abbott

## 2023-01-27 NOTE — Anesthesia Procedure Notes (Signed)
Procedure Name: LMA Insertion Date/Time: 01/27/2023 7:40 AM  Performed by: Karna Dupes, CRNAPre-anesthesia Checklist: Emergency Drugs available, Patient identified, Suction available and Patient being monitored Patient Re-evaluated:Patient Re-evaluated prior to induction Oxygen Delivery Method: Circle system utilized Preoxygenation: Pre-oxygenation with 100% oxygen Induction Type: IV induction LMA: LMA inserted LMA Size: 4.0 Number of attempts: 1 Placement Confirmation: positive ETCO2 and breath sounds checked- equal and bilateral Tube secured with: Tape Dental Injury: Teeth and Oropharynx as per pre-operative assessment

## 2023-01-27 NOTE — Transfer of Care (Signed)
Immediate Anesthesia Transfer of Care Note  Patient: Frederick Stewart  Procedure(s) Performed: OPEN REDUCTION INTERNAL FIXATION (ORIF) PATELLA AND PATELLA TENDON REPAIR (Left: Knee)  Patient Location: PACU  Anesthesia Type:General  Level of Consciousness: drowsy  Airway & Oxygen Therapy: Patient Spontanous Breathing and Patient connected to face mask oxygen  Post-op Assessment: Report given to RN and Post -op Vital signs reviewed and stable  Post vital signs: Reviewed and stable  Last Vitals:  Vitals Value Taken Time  BP 101/57   Temp 97.5   Pulse 44 01/27/23 0858  Resp 11 01/27/23 0858  SpO2 100 % 01/27/23 0858  Vitals shown include unvalidated device data.  Last Pain:  Vitals:   01/27/23 0625  TempSrc: Oral  PainSc: 7          Complications: No notable events documented.

## 2023-01-27 NOTE — Op Note (Signed)
Orthopaedic Surgery Operative Note (CSN: [id number])  Frederick Stewart  2001-06-11 Date of Surgery: 01/27/2023   Diagnoses:  Left patella avulsion fracture distal pole  Procedure: Excision of avulsed distal patella with patellar tendon repair and advancement   Operative Finding Successful completion of the planned procedure.  The distal pole the patella was fractured and loose it looked to be acute the other portions of the tendon were intact.  After the avulsed fragment was removed the remaining patellar tendon was in discontinuity with the remaining portion of the patella and required repair and advancement   Post-Op Diagnosis: Same Surgeons:Primary: Carole Civil, MD Assistants: Franklyn Lor Location: [facility] OR ROOM 4 Anesthesia: General Antibiotics: Ancef 2 g Tourniquet time:  Total Tourniquet Time Documented: Thigh (Left) - 58 minutes Total: Thigh (Left) - 58 minutes  Estimated Blood Loss: None Complications: None Specimens: None Implants: Implant Name Type Inv. Item Serial No. Manufacturer Lot No. LRB No. Used Action  ANCHOR Lottie Rater - [id number] Anchor [employer]OR [place] [id number] Left 1 Implanted    Indications for Surgery:   Frederick Stewart is a 22 y.o. male who was playing basketball and felt a pop in his left knee when he jumped for the basketball.  He did note a 2-week prodromal.  Of pain at the inferior pole the patella.  Benefits and risks of operative and nonoperative management were discussed prior to surgery with patient/guardian(s) and informed consent form was completed.  Specific risks including infection, need for additional surgery, stiffness weakness.   Procedure:   Frederick Stewart was identified in the preop area the surgical site was confirmed and marked chart review was completed.  I checked with the operating room for equipment which was available  X-rays were reviewed  The patient was taken to the operating for general  anesthesia he was in the supine position.  We placed a tourniquet on his left thigh.  His leg was prepped and draped sterilely.  A timeout was completed.  I held the leg elevated for 3 minutes to exsanguinate prior to inflating the tourniquet to 300 mmHg  I made the incision at the inferior pole of patella approxi-3 cm.  I divided the subcutaneous tissue and split the tendon longitudinally and found the fragment which was separated from the main portion of the patella.  There was hematoma noted in the area indicating acute fracture.  I excised the fragment preserving as much tendon as possible  I placed a [employer] bio composite corkscrew anchor.  We did this in the following manner:  First I debrided the end of the patella.  I then placed a drill and the anchor site followed by over drilling with the reamer and then placed the screw.  The screw had for 4 tails on it these were passed through the tendon incorporating some of the normal tendon on each side of the defect.  Due to the sliding nature of the suture anchor this allowed advancement of the tendon back to bone  I then checked the range of motion on the knee and ranged him from 0 to 90 degrees with no tension on the repair  The wound was irrigated and closed with #1 Vicryl and 2-0 Monocryl and Steri-Strips  I injected all around the wound and patella with Marcaine with epinephrine  Sterile dressings and knee immobilizer were applied   Post-operative plan:  The patient will be WBAT on the operative extremity Knee immobilizer No shower for the  first week Follow-up in a week to check the wound and place him in a range of motion brace 0-45 for 2 weeks and then advance as tolerated up to full range of motion

## 2023-01-28 NOTE — Anesthesia Postprocedure Evaluation (Signed)
Anesthesia Post Note  Patient: Frederick Stewart  Procedure(s) Performed: EXCISION OF PATELLA FRAGMENT, PATELLA TENDON ADVANCEMENT (Left: Knee)  Patient location during evaluation: Phase II Anesthesia Type: General Level of consciousness: awake Pain management: pain level controlled Vital Signs Assessment: post-procedure vital signs reviewed and stable Respiratory status: spontaneous breathing and respiratory function stable Cardiovascular status: blood pressure returned to baseline and stable Postop Assessment: no headache and no apparent nausea or vomiting Anesthetic complications: no Comments: Late entry   No notable events documented.   Last Vitals:  Vitals:   01/27/23 0945 01/27/23 1006  BP: 118/78 128/87  Pulse:  (!) 48  Resp: 14 14  Temp:  36.4 C  SpO2: 100%     Last Pain:  Vitals:   01/27/23 1006  TempSrc: Oral  PainSc: [place]

## 2023-01-29 DIAGNOSIS — S82036D Nondisplaced transverse fracture of unspecified patella, subsequent encounter for closed fracture with routine healing: Secondary | ICD-10-CM | POA: Insufficient documentation

## 2023-01-29 DIAGNOSIS — S86812D Strain of other muscle(s) and tendon(s) at lower leg level, left leg, subsequent encounter: Secondary | ICD-10-CM | POA: Insufficient documentation

## 2023-02-02 ENCOUNTER — Encounter (HOSPITAL_COMMUNITY): Payer: Self-pay | Admitting: Orthopedic Surgery

## 2023-02-04 ENCOUNTER — Telehealth: Payer: Self-pay | Admitting: Orthopedic Surgery

## 2023-02-04 ENCOUNTER — Ambulatory Visit (INDEPENDENT_AMBULATORY_CARE_PROVIDER_SITE_OTHER): Payer: Medicaid Other | Admitting: Orthopedic Surgery

## 2023-02-04 ENCOUNTER — Encounter: Payer: Self-pay | Admitting: Orthopedic Surgery

## 2023-02-04 DIAGNOSIS — S82035D Nondisplaced transverse fracture of left patella, subsequent encounter for closed fracture with routine healing: Secondary | ICD-10-CM

## 2023-02-04 DIAGNOSIS — S86812D Strain of other muscle(s) and tendon(s) at lower leg level, left leg, subsequent encounter: Secondary | ICD-10-CM

## 2023-02-04 MED ORDER — HYDROCODONE-ACETAMINOPHEN 10-325 MG PO TABS
1.0000 | ORAL_TABLET | Freq: Four times a day (QID) | ORAL | 0 refills | Status: AC | PRN
Start: 2023-02-04 — End: 2023-02-09

## 2023-02-04 NOTE — Telephone Encounter (Signed)
OK DONE 

## 2023-02-04 NOTE — Progress Notes (Signed)
POD 8   POV # 1   Chief Complaint  Patient presents with   Routine Post Op    L knee DOS 01/27/23 c/o pain      Diagnoses:  Left patella avulsion fracture distal pole   Procedure: Excision of avulsed distal patella with patellar tendon repair and advancement   Operative Finding Successful completion of the planned procedure.  The distal pole the patella was fractured and loose it looked to be acute the other portions of the tendon were intact.  After the avulsed fragment was removed the remaining patellar tendon was in discontinuity with the remaining portion of the patella and required repair and advancement  WOUND: Clean dry intact  PAIN : Wound was cleaned with alcohol.  Clear dressing placed over it.  Range of motion brace 0-40  Range of motion allowed 0-40  Weight-bear as tolerated   return in 2 weeks  Meds ordered this encounter  Medications   HYDROcodone-acetaminophen (NORCO) 10-325 MG tablet    Sig: Take 1 tablet by mouth every 6 (six) hours as needed for up to 5 days.    Dispense:  20 tablet    Refill:  0    Addendum  Hinged knee brace applied urgently to control range of motion prevent postop contracture assist in postoperative rehab

## 2023-02-04 NOTE — Telephone Encounter (Signed)
Will you do addendum to note today since we gave him brace and he has medicaid stating that he needed the brace urgently and why?  So medicaid will cover

## 2023-02-18 ENCOUNTER — Encounter: Payer: Self-pay | Admitting: Orthopedic Surgery

## 2023-02-18 ENCOUNTER — Ambulatory Visit (INDEPENDENT_AMBULATORY_CARE_PROVIDER_SITE_OTHER): Payer: Medicaid Other | Admitting: Orthopedic Surgery

## 2023-02-18 DIAGNOSIS — S82035D Nondisplaced transverse fracture of left patella, subsequent encounter for closed fracture with routine healing: Secondary | ICD-10-CM

## 2023-02-18 NOTE — Progress Notes (Signed)
   This is a postop visit  Encounter Diagnosis  Name Primary?   Closed nondisplaced transverse fracture of left patella with routine healing, subsequent encounter 01/27/23 Yes    Chief Complaint  Patient presents with   Post-op Follow-up    Left knee 01/27/23     The patient is status post excision of patellar tendon fracture fragment advancement of patellar tendon  This is postop day number 22  Postop pain control none needed  Subjective complaints stiffness  Physical exam findings the wound is clean the knee is straight I bent his knee about 45 degrees I advanced the brace for 60 degrees  Assessment and plan  Encourage weightbearing as tolerated  Encourage range of motion 0-60  Follow-up 2 weeks advance brace up to 90 on next visit

## 2023-03-04 ENCOUNTER — Ambulatory Visit (INDEPENDENT_AMBULATORY_CARE_PROVIDER_SITE_OTHER): Payer: Medicaid Other | Admitting: Orthopedic Surgery

## 2023-03-04 DIAGNOSIS — S86812D Strain of other muscle(s) and tendon(s) at lower leg level, left leg, subsequent encounter: Secondary | ICD-10-CM

## 2023-03-04 DIAGNOSIS — G8918 Other acute postprocedural pain: Secondary | ICD-10-CM

## 2023-03-04 DIAGNOSIS — S82035D Nondisplaced transverse fracture of left patella, subsequent encounter for closed fracture with routine healing: Secondary | ICD-10-CM

## 2023-03-04 DIAGNOSIS — M79609 Pain in unspecified limb: Secondary | ICD-10-CM

## 2023-03-04 MED ORDER — HYDROCODONE-ACETAMINOPHEN 10-325 MG PO TABS
1.0000 | ORAL_TABLET | Freq: Every evening | ORAL | 0 refills | Status: AC | PRN
Start: 2023-03-04 — End: 2023-03-18

## 2023-03-04 NOTE — Patient Instructions (Signed)
Exercises 3 times a day  Start physical therapy soon as possible  Continue ibuprofen during the day and the pain pill at night  Continue the brace

## 2023-03-04 NOTE — Progress Notes (Signed)
Postop visit  Chief Complaint  Patient presents with   Routine Post Op    L knee DOS 01/27/23/ unable to flex knee / walking with knee in extension    Status post excision of avulsion fracture distal patella advancement of patellar tendon  This is now postop day 36  Good pain control with ibuprofen except at night  Range of motion now 0-30  Showed him how to do quad sets and heel slides  Start physical therapy to advance range of motion keep brace at 60   May advance brace and therapy as tolerated   Continue ibuprofen and then opioid as needed at nighttime  Meds ordered this encounter  Medications   HYDROcodone-acetaminophen (NORCO) 10-325 MG tablet    Sig: Take 1 tablet by mouth at bedtime as needed for up to 14 days.    Dispense:  14 tablet    Refill:  0

## 2023-03-04 NOTE — Patient Instructions (Signed)
Physical therapy has been ordered for you at [facility]. They should call you to schedule, [phone] is the phone number to call, if you want to call to schedule.   

## 2023-03-05 ENCOUNTER — Other Ambulatory Visit: Payer: Self-pay

## 2023-03-05 ENCOUNTER — Ambulatory Visit (HOSPITAL_COMMUNITY): Payer: Medicaid Other | Attending: Orthopedic Surgery

## 2023-03-05 ENCOUNTER — Encounter (HOSPITAL_COMMUNITY): Payer: Self-pay

## 2023-03-05 DIAGNOSIS — S82035D Nondisplaced transverse fracture of left patella, subsequent encounter for closed fracture with routine healing: Secondary | ICD-10-CM | POA: Diagnosis not present

## 2023-03-05 DIAGNOSIS — S86812D Strain of other muscle(s) and tendon(s) at lower leg level, left leg, subsequent encounter: Secondary | ICD-10-CM | POA: Insufficient documentation

## 2023-03-05 DIAGNOSIS — M25662 Stiffness of left knee, not elsewhere classified: Secondary | ICD-10-CM

## 2023-03-05 DIAGNOSIS — M6281 Muscle weakness (generalized): Secondary | ICD-10-CM | POA: Insufficient documentation

## 2023-03-05 NOTE — Therapy (Addendum)
OUTPATIENT PHYSICAL THERAPY LOWER EXTREMITY EVALUATION   Patient Name: Frederick Stewart MRN: [id number] DOB:11/15/2000, 22 y.o., male Today's Date: 03/08/2023  END OF SESSION:   03/05/23 1528  PT Visits / Re-Eval  Visit Number 1  Number of Visits 12  Date for PT Re-Evaluation 04/16/23  Authorization  Authorization Type medicaid [employer]  Authorization Time Period approved 12 visits from 03/02/23-06/02/2023  Authorization - Visit Number 1  Authorization - Number of Visits 12  Progress Note Due on Visit 10  PT Time Calculation  PT Start Time 1444  PT Stop Time 1515  PT Time Calculation (min) 31 min  PT - End of Session  Behavior During Therapy WFL for tasks assessed/performed   History reviewed. No pertinent past medical history. Past Surgical History:  Procedure Laterality Date   ORIF PATELLA Left 01/27/2023   Procedure: EXCISION OF PATELLA FRAGMENT, PATELLA TENDON ADVANCEMENT;  Surgeon: Vickki Hearing, MD;  Location: [facility];  Service: Orthopedics;  Laterality: Left;   Patient Active Problem List   Diagnosis Date Noted   Closed nondisplaced transverse fracture of patella with routine healing 01/29/2023   Patellar tendon rupture, left, subsequent encounter repair 01/27/23 01/29/2023   Eczema 05/29/2015   Sports physical 08/21/2013   Prolonged Q-T interval on ECG 08/21/2013   Bradycardia 08/21/2013   Well child check 08/18/2013   Need for prophylactic vaccination and inoculation against influenza 08/18/2013   BMI (body mass index), pediatric, 5% to less than 85% for age 52/24/2014   Acne 08/18/2013    PCP: NA  REFERRING PROVIDER: Vickki Hearing MD  REFERRING DIAG:  S82.035D (ICD-10-CM) - Closed nondisplaced transverse fracture of left patella with routine healing, subsequent encounter  S86.812D (ICD-10-CM) - Patellar tendon rupture, left, subsequent encounter    THERAPY DIAG:  Closed nondisplaced transverse fracture of left patella with routine healing - Plan:  PT plan of care cert/re-cert  Decreased range of motion (ROM) of left knee - Plan: PT plan of care cert/re-cert  Muscle weakness (generalized) - Plan: PT plan of care cert/re-cert  Rationale for Evaluation and Treatment: Rehabilitation  ONSET DATE: 01/15/2023  SUBJECTIVE:   SUBJECTIVE STATEMENT: Frederick Stewart reporting that he was playing basketball and went up to jump for ball and heard a pop. Had surgery on 01/15/2023. Haven't been using crutches in about 2 weeks.   PERTINENT HISTORY: ORIF Patella fracture Patellar Tendon Tear PAIN:  Are you having pain? No "sometimes some popping"  PRECAUTIONS: Other: Locked brace @ 60 degrees. Brace and PT  advancement as tolerated.   WEIGHT BEARING RESTRICTIONS:  WBAT  FALLS:  Has patient fallen in last 6 months? No  LIVING ENVIRONMENT: Lives with: lives with their spouse Lives in: House/apartment Stairs:  No stairs currently since living at [facility].  Has following equipment at home: None  OCCUPATION: "nothing right now"  PLOF: Independent  PATIENT GOALS: "walk normal"  NEXT MD VISIT: 04/01/2023  OBJECTIVE:   DIAGNOSTIC FINDINGS:   PATIENT SURVEYS:  LEFS 40/80  COGNITION: Overall cognitive status: Within functional limits for tasks assessed     SENSATION: WFL  POSTURE: No Significant postural limitations  PALPATION: Mild TTP along lateral L knee midline lateral to incision  LOWER EXTREMITY ROM:  Active ROM Right eval Left eval  Hip flexion    Hip extension    Hip abduction    Hip adduction    Hip internal rotation    Hip external rotation    Knee flexion 135 55  Knee extension 0  0  Ankle dorsiflexion    Ankle plantarflexion    Ankle inversion    Ankle eversion     (Blank rows = not tested)  LOWER EXTREMITY MMT:  MMT Right eval Left eval  Hip flexion 4- 2+(pain & L knee)  Hip extension    Hip abduction 4-   Hip adduction    Hip internal rotation    Hip external rotation    Knee flexion    Knee  extension 5 1  Ankle dorsiflexion    Ankle plantarflexion    Ankle inversion    Ankle eversion     (Blank rows = not tested)   FUNCTIONAL TESTS:  2 minute walk test: 265 ft  GAIT: Distance walked: 25ft Assistive device utilized: None Level of assistance: Complete Independence Comments: Antalgic gait pattern with LLE circumduction. W/ knee brace locked @ 60 degrees.    TODAY'S TREATMENT:                                                                                                                              DATE:  PT Evaluation, attendance policy, and HEP    PATIENT EDUCATION:  Education details: PT Evaluation, HEP, daily ROM as tolerated.  Person educated: Patient Education method: Medical illustrator Education comprehension: verbalized understanding and returned demonstration  HOME EXERCISE PROGRAM: Access Code: [email or web address] URL: [email or web address] Date: 03/05/2023 Prepared by: Starling Manns  Exercises - Sidelying Hip Abduction  - 1 x daily - 7 x weekly - 3 sets - 10 reps - Sidelying Hip Adduction  - 1 x daily - 7 x weekly - 3 sets - 10 reps - Prone Hip Extension  - 1 x daily - 7 x weekly - 3 sets - 10 reps - Supine Quadricep Sets  - 1 x daily - 7 x weekly - 3 sets - 10 reps - Supine Heel Slide  - 1 x daily - 7 x weekly - 3 sets - 10 reps - Long Sitting Ankle Plantar Flexion with Resistance  - 1 x daily - 7 x weekly - 3 sets - 10 reps - Long Sitting Ankle Dorsiflexion with Anchored Resistance  - 1 x daily - 7 x weekly - 3 sets - 10 reps - Long Sitting Ankle Inversion with Resistance  - 1 x daily - 7 x weekly - 3 sets - 10 reps - Long Sitting Ankle Eversion with Resistance  - 1 x daily - 7 x weekly - 3 sets - 10 reps  ASSESSMENT:  CLINICAL IMPRESSION: Pt is a 22 year old male who is presenting to physical therapy today for patellar fracture and surgery after basketball injury. Frederick Stewart is referred to physical therapy by Orthopedic surgeon   for Closed nondisplaced transverse fracture of left patella and patellar tendon rupture. Pt's past medical history includes: Excision of avulsed distal patella with patellar tendon repair and advancement on 01/27/23. Pt currently is WBAT in knee brace locked at 60 degrees.  Is still requiring some assistance for car transfers currently, living at home with girlfriend at [facility] house where house has flat entrance. .   Based upon today's evaluation, pt is demonstrating impairments in functional mobility, gait, transfers, and ADLs  due to LLE muscle weakness, LLE ROM deficits, myofascial/soft tissue mobility deficits and mild Left knee pain. Pt would benefit from skilled physical therapy services to address the above impairments/limitations and improve functional mobility and ADL capacity.   OBJECTIVE IMPAIRMENTS: Abnormal gait, decreased mobility, decreased ROM, decreased strength, hypomobility, impaired flexibility, and pain.   ACTIVITY LIMITATIONS: carrying, lifting, sitting, standing, squatting, sleeping, stairs, transfers, and locomotion level  PARTICIPATION LIMITATIONS: community activity, occupation, and yard work  PERSONAL FACTORS: Age are also affecting patient's functional outcome.   REHAB POTENTIAL: Excellent  CLINICAL DECISION MAKING: Stable/uncomplicated  EVALUATION COMPLEXITY: Low   GOALS: Goals reviewed with patient? No  SHORT TERM GOALS: Target date: 03/26/2023  Pt and caregivers will be independent with HEP in order to demonstrate participation in Physical Therapy POC.  Baseline: Goal status: INITIAL  2.  Pt will report 10% increase in subjective functional performance in order to demonstrate improved functional capacity.  Baseline: Pt reports 40% Goal status: INITIAL  LONG TERM GOALS: Target date: 04/16/2023  Pt will ambulate with nonantalgic symmetrical normal gait biomechanics for >500 feet in order to demonstrate increase in functional mobility. Baseline: See  objective Goal status: INITIAL  2.  Pt will increase LLE MMT to +4/5 or equal to RLE MMT in order to demonstrate increased muscular strength gain. Baseline: See objective Goal status: INITIAL  3.  Pt will improve L knee range of motion to 120 degrees or equal to R knee range of motion to improve upright functional mobility. Baseline: See objective Goal status: INITIAL  4.  Pt will improve LFES score by >50% of original score in order to demonstrate improved functional use of LLE. Baseline: see objective Goal status: INITIAL  PLAN:  PT FREQUENCY: 2x/week  PT DURATION: 6 weeks  PLANNED INTERVENTIONS: Therapeutic exercises, Therapeutic activity, Neuromuscular re-education, Balance training, Gait training, Patient/Family education, Self Care, Joint mobilization, Joint manipulation, Stair training, Dry Needling, Electrical stimulation, Cryotherapy, scar mobilization, Manual therapy, and Re-evaluation  PLAN FOR NEXT SESSION: L knee range of motion, [place] e-stim for L quad activation, patellar mobilizations, heel slides.  Starting from 6-12 Week [facility] Patellar Tendon protocol*   Nelida Meuse, PT 03/08/2023, 12:30 PM

## 2023-03-08 ENCOUNTER — Encounter (HOSPITAL_COMMUNITY): Payer: Self-pay | Admitting: Physical Therapy

## 2023-03-08 ENCOUNTER — Ambulatory Visit (HOSPITAL_COMMUNITY): Payer: Medicaid Other | Admitting: Physical Therapy

## 2023-03-08 DIAGNOSIS — S86812D Strain of other muscle(s) and tendon(s) at lower leg level, left leg, subsequent encounter: Secondary | ICD-10-CM | POA: Diagnosis not present

## 2023-03-08 DIAGNOSIS — M25662 Stiffness of left knee, not elsewhere classified: Secondary | ICD-10-CM | POA: Diagnosis not present

## 2023-03-08 DIAGNOSIS — M6281 Muscle weakness (generalized): Secondary | ICD-10-CM | POA: Diagnosis not present

## 2023-03-08 DIAGNOSIS — S82035D Nondisplaced transverse fracture of left patella, subsequent encounter for closed fracture with routine healing: Secondary | ICD-10-CM

## 2023-03-08 NOTE — Therapy (Addendum)
OUTPATIENT PHYSICAL THERAPY TREATMENT   Patient Name: Frederick Stewart MRN: [id number] DOB:[birth year], 22 y.o., male Today's Date: 03/08/2023  END OF SESSION:  PT End of Session - 03/08/23 1121     Visit Number 2    Number of Visits 12    Date for PT Re-Evaluation 04/16/23    Authorization Type medicaid [employer]    Authorization Time Period approved 12 visits from 03/02/23-06/02/2023    Authorization - Visit Number 1    Authorization - Number of Visits 12    Progress Note Due on Visit 10    PT Start Time 1121    PT Stop Time 1200    PT Time Calculation (min) 39 min    Behavior During Therapy [facility] for tasks assessed/performed             History reviewed. No pertinent past medical history. Past Surgical History:  Procedure Laterality Date   ORIF PATELLA Left 01/27/2023   Procedure: EXCISION OF PATELLA FRAGMENT, PATELLA TENDON ADVANCEMENT;  Surgeon: Vickki Hearing, MD;  Location: [facility];  Service: Orthopedics;  Laterality: Left;   Patient Active Problem List   Diagnosis Date Noted   Closed nondisplaced transverse fracture of patella with routine healing 01/29/2023   Patellar tendon rupture, left, subsequent encounter repair 01/27/23 01/29/2023   Eczema 05/29/2015   Sports physical 08/21/2013   Prolonged Q-T interval on ECG 08/21/2013   Bradycardia 08/21/2013   Well child check 08/18/2013   Need for prophylactic vaccination and inoculation against influenza 08/18/2013   BMI (body mass index), pediatric, 5% to less than 85% for age 65/24/2014   Acne 08/18/2013    PCP: NA  REFERRING PROVIDER: Vickki Hearing MD  REFERRING DIAG:  S82.035D (ICD-10-CM) - Closed nondisplaced transverse fracture of left patella with routine healing, subsequent encounter  S86.812D (ICD-10-CM) - Patellar tendon rupture, left, subsequent encounter    THERAPY DIAG:  Closed nondisplaced transverse fracture of left patella with routine healing  Decreased range of motion (ROM) of left  knee  Muscle weakness (generalized)  Rationale for Evaluation and Treatment: Rehabilitation  ONSET DATE: 01/15/2023  SUBJECTIVE:   SUBJECTIVE STATEMENT: Patient states been working it more. HEP going well.   PERTINENT HISTORY: ORIF Patella fracture Patellar Tendon Tear PAIN:  Are you having pain? No "sometimes some popping"  PRECAUTIONS: Other: Locked brace @ 60 degrees. Brace and PT  advancement as tolerated.   WEIGHT BEARING RESTRICTIONS:  WBAT  FALLS:  Has patient fallen in last 6 months? No  LIVING ENVIRONMENT: Lives with: lives with their spouse Lives in: House/apartment Stairs:  No stairs currently since living at [facility].  Has following equipment at home: None  OCCUPATION: "nothing right now"  PLOF: Independent  PATIENT GOALS: "walk normal"  NEXT MD VISIT: 04/01/2023  OBJECTIVE:   DIAGNOSTIC FINDINGS:   PATIENT SURVEYS:  LEFS 40/80  COGNITION: Overall cognitive status: Within functional limits for tasks assessed     SENSATION: WFL  POSTURE: No Significant postural limitations  PALPATION: Mild TTP along lateral L knee midline lateral to incision  LOWER EXTREMITY ROM:  Active ROM Right eval Left eval Left  03/08/23  Hip flexion     Hip extension     Hip abduction     Hip adduction     Hip internal rotation     Hip external rotation     Knee flexion 55 135 50 AAROM 70  Knee extension 0 0 2 hyperextension  Ankle dorsiflexion  Ankle plantarflexion     Ankle inversion     Ankle eversion      (Blank rows = not tested)  LOWER EXTREMITY MMT:  MMT Right eval Left eval  Hip flexion 4- 2+(pain & L knee)  Hip extension    Hip abduction 4-   Hip adduction    Hip internal rotation    Hip external rotation    Knee flexion    Knee extension 5 1  Ankle dorsiflexion    Ankle plantarflexion    Ankle inversion    Ankle eversion     (Blank rows = not tested)   FUNCTIONAL TESTS:  2 minute walk test: 265 ft  GAIT: Distance  walked: 283ft Assistive device utilized: None Level of assistance: Complete Independence Comments: Antalgic gait pattern with LLE circumduction. W/ knee brace locked @ 60 degrees.    TODAY'S TREATMENT:                                                                                                                              DATE:  03/08/23 Heel slides 10 x 10 second holds with strap L Quad set 2 x 5 with 10 second holds L Sidelying hip abduction 2 x 15 L Prone hip extension 2 x 15 L Standing HR 1 x 20 Standing TR 1 x 20 Mini squat 2 x 10   EVAL: PT Evaluation, attendance policy, and HEP    PATIENT EDUCATION:  Education details:03/08/23: HEP;  EVAL: PT Evaluation, HEP, daily ROM as tolerated.  Person educated: Patient Education method: Medical illustrator Education comprehension: verbalized understanding and returned demonstration  HOME EXERCISE PROGRAM: Access Code: [email or web address] URL: [email or web address]  03/08/23- Standing Heel Raises  - 3 x daily - 7 x weekly - 2 sets - 20 reps - Toe Raises with Counter Support  - 3 x daily - 7 x weekly - 2 sets - 20 reps - Mini Squat with Counter Support  - 3 x daily - 7 x weekly - 3 sets - 10 reps  Date: 03/05/2023 - Sidelying Hip Abduction  - 1 x daily - 7 x weekly - 3 sets - 10 reps - Sidelying Hip Adduction  - 1 x daily - 7 x weekly - 3 sets - 10 reps - Prone Hip Extension  - 1 x daily - 7 x weekly - 3 sets - 10 reps - Supine Quadricep Sets  - 1 x daily - 7 x weekly - 3 sets - 10 reps - Supine Heel Slide  - 1 x daily - 7 x weekly - 3 sets - 10 reps - Long Sitting Ankle Plantar Flexion with Resistance  - 1 x daily - 7 x weekly - 3 sets - 10 reps - Long Sitting Ankle Dorsiflexion with Anchored Resistance  - 1 x daily - 7 x weekly - 3 sets - 10 reps - Long Sitting Ankle Inversion with Resistance  - 1 x daily - 7  x weekly - 3 sets - 10 reps - Long Sitting Ankle Eversion with Resistance  - 1 x daily - 7 x weekly - 3  sets - 10 reps  ASSESSMENT:  CLINICAL IMPRESSION: Patient with AROM 2 hyperextension to 60 with AAROM 70 with heel slides. Improving quad set but unable to perform SLR due to weakness. Cueing for quad activation with gluteal strengthening. Began additional strengthening which is tolerated well. Mini squat with good mechanics, cueing for limiting depth to when weight shift off LLE begins. Educated on high reps of HEP to improve mobility and strength. Patient will continue to benefit from physical therapy in order to improve function and reduce impairment.   OBJECTIVE IMPAIRMENTS: Abnormal gait, decreased mobility, decreased ROM, decreased strength, hypomobility, impaired flexibility, and pain.   ACTIVITY LIMITATIONS: carrying, lifting, sitting, standing, squatting, sleeping, stairs, transfers, and locomotion level  PARTICIPATION LIMITATIONS: community activity, occupation, and yard work  PERSONAL FACTORS: Age are also affecting patient's functional outcome.   REHAB POTENTIAL: Excellent  CLINICAL DECISION MAKING: Stable/uncomplicated  EVALUATION COMPLEXITY: Low   GOALS: Goals reviewed with patient? No  SHORT TERM GOALS: Target date: 03/26/2023  Pt and caregivers will be independent with HEP in order to demonstrate participation in Physical Therapy POC.  Baseline: Goal status: INITIAL  2.  Pt will report 10% increase in subjective functional performance in order to demonstrate improved functional capacity.  Baseline: Pt reports 40% Goal status: INITIAL  LONG TERM GOALS: Target date: 04/16/2023  Pt will ambulate with nonantalgic symmetrical normal gait biomechanics for >500 feet in order to demonstrate increase in functional mobility. Baseline: See objective Goal status: INITIAL  2.  Pt will increase LLE MMT to +4/5 or equal to RLE MMT in order to demonstrate increased muscular strength gain. Baseline: See objective Goal status: INITIAL  3.  Pt will improve L knee range of  motion to 120 degrees or equal to R knee range of motion to improve upright functional mobility. Baseline: See objective Goal status: INITIAL  4.  Pt will improve LFES score by >50% of original score in order to demonstrate improved functional use of LLE. Baseline: see objective Goal status: INITIAL  PLAN:  PT FREQUENCY: 2x/week  PT DURATION: 6 weeks  PLANNED INTERVENTIONS: Therapeutic exercises, Therapeutic activity, Neuromuscular re-education, Balance training, Gait training, Patient/Family education, Self Care, Joint mobilization, Joint manipulation, Stair training, Dry Needling, Electrical stimulation, Cryotherapy, scar mobilization, Manual therapy, and Re-evaluation  PLAN FOR NEXT SESSION: L knee range of motion, [place] e-stim for L quad activation, patellar mobilizations,  Starting from 6-12 Week [facility] Patellar Tendon protocol*   Reola Mosher Larra Crunkleton, PT 03/08/2023, 12:34 PM

## 2023-03-10 ENCOUNTER — Encounter (HOSPITAL_COMMUNITY): Payer: Medicaid Other

## 2023-03-12 ENCOUNTER — Ambulatory Visit (HOSPITAL_COMMUNITY): Payer: Medicaid Other

## 2023-03-12 ENCOUNTER — Encounter (HOSPITAL_COMMUNITY): Payer: Self-pay

## 2023-03-12 DIAGNOSIS — S82035D Nondisplaced transverse fracture of left patella, subsequent encounter for closed fracture with routine healing: Secondary | ICD-10-CM | POA: Diagnosis not present

## 2023-03-12 DIAGNOSIS — S86812D Strain of other muscle(s) and tendon(s) at lower leg level, left leg, subsequent encounter: Secondary | ICD-10-CM | POA: Diagnosis not present

## 2023-03-12 DIAGNOSIS — M6281 Muscle weakness (generalized): Secondary | ICD-10-CM | POA: Diagnosis not present

## 2023-03-12 DIAGNOSIS — M25662 Stiffness of left knee, not elsewhere classified: Secondary | ICD-10-CM

## 2023-03-12 NOTE — Therapy (Signed)
OUTPATIENT PHYSICAL THERAPY TREATMENT   Patient Name: Frederick Stewart MRN: [id number] DOB:03-Apr-2001, 22 y.o., male Today's Date: 03/12/2023  END OF SESSION:  PT End of Session - 03/12/23 1436     Visit Number 3    Number of Visits 12    Date for PT Re-Evaluation 04/16/23    Authorization Type medicaid [employer]    Authorization Time Period approved 12 visits from 03/02/23-06/02/2023    Authorization - Visit Number 2    Authorization - Number of Visits 12    Progress Note Due on Visit 10    PT Start Time 1345    PT Stop Time 1430    PT Time Calculation (min) 45 min    Activity Tolerance Patient tolerated treatment well    Behavior During Therapy [facility] for tasks assessed/performed              History reviewed. No pertinent past medical history. Past Surgical History:  Procedure Laterality Date   ORIF PATELLA Left 01/27/2023   Procedure: EXCISION OF PATELLA FRAGMENT, PATELLA TENDON ADVANCEMENT;  Surgeon: Vickki Hearing, MD;  Location: [facility];  Service: Orthopedics;  Laterality: Left;   Patient Active Problem List   Diagnosis Date Noted   Closed nondisplaced transverse fracture of patella with routine healing 01/29/2023   Patellar tendon rupture, left, subsequent encounter repair 01/27/23 01/29/2023   Eczema 05/29/2015   Sports physical 08/21/2013   Prolonged Q-T interval on ECG 08/21/2013   Bradycardia 08/21/2013   Well child check 08/18/2013   Need for prophylactic vaccination and inoculation against influenza 08/18/2013   BMI (body mass index), pediatric, 5% to less than 85% for age 70/24/2014   Acne 08/18/2013    PCP: NA  REFERRING PROVIDER: Vickki Hearing MD  REFERRING DIAG:  S82.035D (ICD-10-CM) - Closed nondisplaced transverse fracture of left patella with routine healing, subsequent encounter  S86.812D (ICD-10-CM) - Patellar tendon rupture, left, subsequent encounter    THERAPY DIAG:  Closed nondisplaced transverse fracture of left patella with  routine healing  Decreased range of motion (ROM) of left knee  Muscle weakness (generalized)  Rationale for Evaluation and Treatment: Rehabilitation  ONSET DATE: 01/15/2023  SUBJECTIVE:   SUBJECTIVE STATEMENT: Sore today from more working out. Increasing reps and ROM.  PERTINENT HISTORY: ORIF Patella fracture Patellar Tendon Tear PAIN:  Are you having pain? No "sometimes some popping"  PRECAUTIONS: Other: Locked brace @ 60 degrees. Brace and PT  advancement as tolerated.   WEIGHT BEARING RESTRICTIONS:  WBAT  FALLS:  Has patient fallen in last 6 months? No  LIVING ENVIRONMENT: Lives with: lives with their spouse Lives in: House/apartment Stairs:  No stairs currently since living at [facility].  Has following equipment at home: None  OCCUPATION: "nothing right now"  PLOF: Independent  PATIENT GOALS: "walk normal"  NEXT MD VISIT: 04/01/2023  OBJECTIVE:   DIAGNOSTIC FINDINGS:   PATIENT SURVEYS:  LEFS 40/80  COGNITION: Overall cognitive status: Within functional limits for tasks assessed     SENSATION: WFL  POSTURE: No Significant postural limitations  PALPATION: Mild TTP along lateral L knee midline lateral to incision  LOWER EXTREMITY ROM:  Active ROM Right eval Left eval Left  03/08/23  Hip flexion     Hip extension     Hip abduction     Hip adduction     Hip internal rotation     Hip external rotation     Knee flexion 55 135 50 AAROM 70  Knee extension 0  0 2 hyperextension  Ankle dorsiflexion     Ankle plantarflexion     Ankle inversion     Ankle eversion      (Blank rows = not tested)  LOWER EXTREMITY MMT:  MMT Right eval Left eval  Hip flexion 4- 2+(pain & L knee)  Hip extension    Hip abduction 4-   Hip adduction    Hip internal rotation    Hip external rotation    Knee flexion    Knee extension 5 1  Ankle dorsiflexion    Ankle plantarflexion    Ankle inversion    Ankle eversion     (Blank rows = not  tested)   FUNCTIONAL TESTS:  2 minute walk test: 265 ft  GAIT: Distance walked: 29ft Assistive device utilized: None Level of assistance: Complete Independence Comments: Antalgic gait pattern with LLE circumduction. W/ knee brace locked @ 60 degrees.    TODAY'S TREATMENT:                                                                                                                              DATE:  03/12/2023  -Heel slides 5 x 10' hold with L strap; self massaging tender/tight areas.  -Recumbent bike rotations 7'; brace off -Quad sets 15 x 10' with towels underneath knee.  -Sidelying hip abduction 2lb ankle weight 2 x 15lb   03/08/23 Heel slides 10 x 10 second holds with strap L Quad set 2 x 5 with 10 second holds L Sidelying hip abduction 2 x 15 L Prone hip extension 2 x 15 L Standing HR 1 x 20 Standing TR 1 x 20 Mini squat 2 x 10   EVAL: PT Evaluation, attendance policy, and HEP    PATIENT EDUCATION:  Education details:03/12/2023 Using towel underneath for quad set.  Education method: Medical illustrator Education comprehension: verbalized understanding and returned demonstration  HOME EXERCISE PROGRAM: Access Code: [email or web address] URL: [email or web address]  03/08/23- Standing Heel Raises  - 3 x daily - 7 x weekly - 2 sets - 20 reps - Toe Raises with Counter Support  - 3 x daily - 7 x weekly - 2 sets - 20 reps - Mini Squat with Counter Support  - 3 x daily - 7 x weekly - 3 sets - 10 reps  Date: 03/05/2023 - Sidelying Hip Abduction  - 1 x daily - 7 x weekly - 3 sets - 10 reps - Sidelying Hip Adduction  - 1 x daily - 7 x weekly - 3 sets - 10 reps - Prone Hip Extension  - 1 x daily - 7 x weekly - 3 sets - 10 reps - Supine Quadricep Sets  - 1 x daily - 7 x weekly - 3 sets - 10 reps - Supine Heel Slide  - 1 x daily - 7 x weekly - 3 sets - 10 reps - Long Sitting Ankle Plantar Flexion with Resistance  - 1 x daily -  7 x weekly - 3 sets - 10 reps -  Long Sitting Ankle Dorsiflexion with Anchored Resistance  - 1 x daily - 7 x weekly - 3 sets - 10 reps - Long Sitting Ankle Inversion with Resistance  - 1 x daily - 7 x weekly - 3 sets - 10 reps - Long Sitting Ankle Eversion with Resistance  - 1 x daily - 7 x weekly - 3 sets - 10 reps  ASSESSMENT:  CLINICAL IMPRESSION: Pt tolerating session well. Working more through ROM passive and actively. Tolerating recumbent bike well this session for more AAROM. Tolerated towels underneath knee for increased ROM for further quadriceps strengthening progression. Patient will continue to benefit from physical therapy in order to improve function and reduce impairment.   OBJECTIVE IMPAIRMENTS: Abnormal gait, decreased mobility, decreased ROM, decreased strength, hypomobility, impaired flexibility, and pain.   ACTIVITY LIMITATIONS: carrying, lifting, sitting, standing, squatting, sleeping, stairs, transfers, and locomotion level  PARTICIPATION LIMITATIONS: community activity, occupation, and yard work  PERSONAL FACTORS: Age are also affecting patient's functional outcome.   REHAB POTENTIAL: Excellent  CLINICAL DECISION MAKING: Stable/uncomplicated  EVALUATION COMPLEXITY: Low   GOALS: Goals reviewed with patient? No  SHORT TERM GOALS: Target date: 03/26/2023  Pt and caregivers will be independent with HEP in order to demonstrate participation in Physical Therapy POC.  Baseline: Goal status: INITIAL  2.  Pt will report 10% increase in subjective functional performance in order to demonstrate improved functional capacity.  Baseline: Pt reports 40% Goal status: INITIAL  LONG TERM GOALS: Target date: 04/16/2023  Pt will ambulate with nonantalgic symmetrical normal gait biomechanics for >500 feet in order to demonstrate increase in functional mobility. Baseline: See objective Goal status: INITIAL  2.  Pt will increase LLE MMT to +4/5 or equal to RLE MMT in order to demonstrate increased muscular  strength gain. Baseline: See objective Goal status: INITIAL  3.  Pt will improve L knee range of motion to 120 degrees or equal to R knee range of motion to improve upright functional mobility. Baseline: See objective Goal status: INITIAL  4.  Pt will improve LFES score by >50% of original score in order to demonstrate improved functional use of LLE. Baseline: see objective Goal status: INITIAL  PLAN:  PT FREQUENCY: 2x/week  PT DURATION: 6 weeks  PLANNED INTERVENTIONS: Therapeutic exercises, Therapeutic activity, Neuromuscular re-education, Balance training, Gait training, Patient/Family education, Self Care, Joint mobilization, Joint manipulation, Stair training, Dry Needling, Electrical stimulation, Cryotherapy, scar mobilization, Manual therapy, and Re-evaluation  PLAN FOR NEXT SESSION: L knee range of motion, [place] e-stim for L quad activation, patellar mobilizations,  Starting from 6-12 Week [facility] Patellar Tendon protocol*   Nelida Meuse, PT 03/12/2023, 2:37 PM

## 2023-03-15 ENCOUNTER — Encounter (HOSPITAL_COMMUNITY): Payer: Self-pay

## 2023-03-15 ENCOUNTER — Ambulatory Visit (HOSPITAL_COMMUNITY): Payer: Medicaid Other

## 2023-03-15 DIAGNOSIS — S86812D Strain of other muscle(s) and tendon(s) at lower leg level, left leg, subsequent encounter: Secondary | ICD-10-CM | POA: Diagnosis not present

## 2023-03-15 DIAGNOSIS — S82035D Nondisplaced transverse fracture of left patella, subsequent encounter for closed fracture with routine healing: Secondary | ICD-10-CM

## 2023-03-15 DIAGNOSIS — M25662 Stiffness of left knee, not elsewhere classified: Secondary | ICD-10-CM | POA: Diagnosis not present

## 2023-03-15 DIAGNOSIS — M6281 Muscle weakness (generalized): Secondary | ICD-10-CM

## 2023-03-15 NOTE — Therapy (Signed)
OUTPATIENT PHYSICAL THERAPY TREATMENT   Patient Name: Frederick Stewart MRN: [id number] DOB:10-Mar-2001, 22 y.o., male Today's Date: 03/15/2023  END OF SESSION:  PT End of Session - 03/15/23 1602     Visit Number 4    Number of Visits 12    Date for PT Re-Evaluation 04/16/23    Authorization Type medicaid [employer]    Authorization Time Period approved 12 visits from 03/02/23-06/02/2023    Authorization - Visit Number 3    Authorization - Number of Visits 12    Progress Note Due on Visit 10    PT Start Time 1520    PT Stop Time 1559    PT Time Calculation (min) 39 min    Activity Tolerance Patient tolerated treatment well    Behavior During Therapy [facility] for tasks assessed/performed               History reviewed. No pertinent past medical history. Past Surgical History:  Procedure Laterality Date   ORIF PATELLA Left 01/27/2023   Procedure: EXCISION OF PATELLA FRAGMENT, PATELLA TENDON ADVANCEMENT;  Surgeon: Vickki Hearing, MD;  Location: [facility];  Service: Orthopedics;  Laterality: Left;   Patient Active Problem List   Diagnosis Date Noted   Closed nondisplaced transverse fracture of patella with routine healing 01/29/2023   Patellar tendon rupture, left, subsequent encounter repair 01/27/23 01/29/2023   Eczema 05/29/2015   Sports physical 08/21/2013   Prolonged Q-T interval on ECG 08/21/2013   Bradycardia 08/21/2013   Well child check 08/18/2013   Need for prophylactic vaccination and inoculation against influenza 08/18/2013   BMI (body mass index), pediatric, 5% to less than 85% for age 32/24/2014   Acne 08/18/2013    PCP: NA  REFERRING PROVIDER: Vickki Hearing MD  REFERRING DIAG:  S82.035D (ICD-10-CM) - Closed nondisplaced transverse fracture of left patella with routine healing, subsequent encounter  S86.812D (ICD-10-CM) - Patellar tendon rupture, left, subsequent encounter    THERAPY DIAG:  Closed nondisplaced transverse fracture of left patella with  routine healing  Decreased range of motion (ROM) of left knee  Muscle weakness (generalized)  Rationale for Evaluation and Treatment: Rehabilitation  ONSET DATE: 01/15/2023  SUBJECTIVE:   SUBJECTIVE STATEMENT: Pt reporting some discomfort while attempting to drive.  PERTINENT HISTORY: ORIF Patella fracture Patellar Tendon Tear PAIN:  Are you having pain? No "sometimes some popping"  PRECAUTIONS: Other: Locked brace @ 60 degrees. Brace and PT  advancement as tolerated.   WEIGHT BEARING RESTRICTIONS:  WBAT  FALLS:  Has patient fallen in last 6 months? No  LIVING ENVIRONMENT: Lives with: lives with their spouse Lives in: House/apartment Stairs:  No stairs currently since living at [facility].  Has following equipment at home: None  OCCUPATION: "nothing right now"  PLOF: Independent  PATIENT GOALS: "walk normal"  NEXT MD VISIT: 04/01/2023  OBJECTIVE:   DIAGNOSTIC FINDINGS:   PATIENT SURVEYS:  LEFS 40/80  COGNITION: Overall cognitive status: Within functional limits for tasks assessed     SENSATION: WFL  POSTURE: No Significant postural limitations  PALPATION: Mild TTP along lateral L knee midline lateral to incision  LOWER EXTREMITY ROM:  Active ROM Right eval Left eval Left  03/08/23  Hip flexion     Hip extension     Hip abduction     Hip adduction     Hip internal rotation     Hip external rotation     Knee flexion 55 135 50 AAROM 70  Knee extension 0 0  2 hyperextension  Ankle dorsiflexion     Ankle plantarflexion     Ankle inversion     Ankle eversion      (Blank rows = not tested)  LOWER EXTREMITY MMT:  MMT Right eval Left eval  Hip flexion 4- 2+(pain & L knee)  Hip extension    Hip abduction 4-   Hip adduction    Hip internal rotation    Hip external rotation    Knee flexion    Knee extension 5 1  Ankle dorsiflexion    Ankle plantarflexion    Ankle inversion    Ankle eversion     (Blank rows = not tested)   FUNCTIONAL  TESTS:  2 minute walk test: 265 ft  GAIT: Distance walked: 248ft Assistive device utilized: None Level of assistance: Complete Independence Comments: Antalgic gait pattern with LLE circumduction. W/ knee brace locked @ 60 degrees.    TODAY'S TREATMENT:                                                                                                                              DATE:  03/15/2023  -7 mins recumbent bike; AAROM with RLE. -SAQ with towel x 20 w/ 5sec cue with 5 sec on/off. [place] estim ( utilized as component.) -Mini squats on elevated bench 2 x 10; cues for L weight shift -TKE with RTB x 20  03/12/2023  -Heel slides 5 x 10' hold with L strap; self massaging tender/tight areas.  -Recumbent bike rotations 7'; brace off -Quad sets 15 x 10' with towels underneath knee.  -Sidelying hip abduction 2lb ankle weight 2 x 15lb   03/08/23 Heel slides 10 x 10 second holds with strap L Quad set 2 x 5 with 10 second holds L Sidelying hip abduction 2 x 15 L Prone hip extension 2 x 15 L Standing HR 1 x 20 Standing TR 1 x 20 Mini squat 2 x 10   EVAL: PT Evaluation, attendance policy, and HEP    PATIENT EDUCATION:  Education details:03/12/2023 Using towel underneath for quad set.  Education method: Medical illustrator Education comprehension: verbalized understanding and returned demonstration  HOME EXERCISE PROGRAM: Access Code: [email or web address] URL: [email or web address]  03/08/23- Standing Heel Raises  - 3 x daily - 7 x weekly - 2 sets - 20 reps - Toe Raises with Counter Support  - 3 x daily - 7 x weekly - 2 sets - 20 reps - Mini Squat with Counter Support  - 3 x daily - 7 x weekly - 3 sets - 10 reps  Date: 03/05/2023 - Sidelying Hip Abduction  - 1 x daily - 7 x weekly - 3 sets - 10 reps - Sidelying Hip Adduction  - 1 x daily - 7 x weekly - 3 sets - 10 reps - Prone Hip Extension  - 1 x daily - 7 x weekly - 3 sets - 10 reps - Supine Quadricep Sets  -  1 x daily - 7 x weekly - 3 sets - 10 reps - Supine Heel Slide  - 1 x daily - 7 x weekly - 3 sets - 10 reps - Long Sitting Ankle Plantar Flexion with Resistance  - 1 x daily - 7 x weekly - 3 sets - 10 reps - Long Sitting Ankle Dorsiflexion with Anchored Resistance  - 1 x daily - 7 x weekly - 3 sets - 10 reps - Long Sitting Ankle Inversion with Resistance  - 1 x daily - 7 x weekly - 3 sets - 10 reps - Long Sitting Ankle Eversion with Resistance  - 1 x daily - 7 x weekly - 3 sets - 10 reps  ASSESSMENT:  CLINICAL IMPRESSION: Pt tolerating session well. Improved quad control with performing SAQ without assistance. Continues ambulate antalgically with limited knee flexion due to ROM limitations. Tolerates progressions well. Fatigues appropriately. Overall progress made with quadriceps contraction this session.  Patient will continue to benefit from physical therapy in order to improve function and reduce impairment.   OBJECTIVE IMPAIRMENTS: Abnormal gait, decreased mobility, decreased ROM, decreased strength, hypomobility, impaired flexibility, and pain.   ACTIVITY LIMITATIONS: carrying, lifting, sitting, standing, squatting, sleeping, stairs, transfers, and locomotion level  PARTICIPATION LIMITATIONS: community activity, occupation, and yard work  PERSONAL FACTORS: Age are also affecting patient's functional outcome.   REHAB POTENTIAL: Excellent  CLINICAL DECISION MAKING: Stable/uncomplicated  EVALUATION COMPLEXITY: Low   GOALS: Goals reviewed with patient? No  SHORT TERM GOALS: Target date: 03/26/2023  Pt and caregivers will be independent with HEP in order to demonstrate participation in Physical Therapy POC.  Baseline: Goal status: INITIAL  2.  Pt will report 10% increase in subjective functional performance in order to demonstrate improved functional capacity.  Baseline: Pt reports 40% Goal status: INITIAL  LONG TERM GOALS: Target date: 04/16/2023  Pt will ambulate with  nonantalgic symmetrical normal gait biomechanics for >500 feet in order to demonstrate increase in functional mobility. Baseline: See objective Goal status: INITIAL  2.  Pt will increase LLE MMT to +4/5 or equal to RLE MMT in order to demonstrate increased muscular strength gain. Baseline: See objective Goal status: INITIAL  3.  Pt will improve L knee range of motion to 120 degrees or equal to R knee range of motion to improve upright functional mobility. Baseline: See objective Goal status: INITIAL  4.  Pt will improve LFES score by >50% of original score in order to demonstrate improved functional use of LLE. Baseline: see objective Goal status: INITIAL  PLAN:  PT FREQUENCY: 2x/week  PT DURATION: 6 weeks  PLANNED INTERVENTIONS: Therapeutic exercises, Therapeutic activity, Neuromuscular re-education, Balance training, Gait training, Patient/Family education, Self Care, Joint mobilization, Joint manipulation, Stair training, Dry Needling, Electrical stimulation, Cryotherapy, scar mobilization, Manual therapy, and Re-evaluation  PLAN FOR NEXT SESSION: L knee range of motion, [place] e-stim for L quad activation, patellar mobilizations,  Starting from 6-12 Week [facility] Patellar Tendon protocol*   Nelida Meuse, PT 03/15/2023, 4:06 PM

## 2023-03-16 ENCOUNTER — Encounter (HOSPITAL_COMMUNITY): Payer: Medicaid Other

## 2023-03-17 ENCOUNTER — Encounter (HOSPITAL_COMMUNITY): Payer: Medicaid Other

## 2023-03-19 ENCOUNTER — Encounter (HOSPITAL_COMMUNITY): Payer: Medicaid Other | Admitting: Physical Therapy

## 2023-03-23 ENCOUNTER — Encounter (HOSPITAL_COMMUNITY): Payer: Self-pay

## 2023-03-23 ENCOUNTER — Ambulatory Visit (HOSPITAL_COMMUNITY): Payer: Medicaid Other

## 2023-03-23 DIAGNOSIS — M6281 Muscle weakness (generalized): Secondary | ICD-10-CM | POA: Diagnosis not present

## 2023-03-23 DIAGNOSIS — S82035D Nondisplaced transverse fracture of left patella, subsequent encounter for closed fracture with routine healing: Secondary | ICD-10-CM

## 2023-03-23 DIAGNOSIS — M25662 Stiffness of left knee, not elsewhere classified: Secondary | ICD-10-CM

## 2023-03-23 DIAGNOSIS — S86812D Strain of other muscle(s) and tendon(s) at lower leg level, left leg, subsequent encounter: Secondary | ICD-10-CM | POA: Diagnosis not present

## 2023-03-23 NOTE — Therapy (Addendum)
OUTPATIENT PHYSICAL THERAPY TREATMENT   Patient Name: Frederick Stewart MRN: [id number] DOB:Sep 17, 2001, 22 y.o., male Today's Date: 03/23/2023  END OF SESSION:     03/23/23 1553  PT Visits / Re-Eval  Visit Number 5  Number of Visits 12  Date for PT Re-Evaluation 04/16/23  Authorization  Authorization Type medicaid [employer]  Authorization Time Period approved 12 visits from 03/02/23-06/02/2023  Authorization - Visit Number 4  Authorization - Number of Visits 12  Progress Note Due on Visit 10  PT Time Calculation  PT Start Time 1447  PT Stop Time 1527  PT Time Calculation (min) 40 min  PT - End of Session  Activity Tolerance Patient tolerated treatment well  Behavior During Therapy [facility] for tasks assessed/performed     History reviewed. No pertinent past medical history. Past Surgical History:  Procedure Laterality Date   ORIF PATELLA Left 01/27/2023   Procedure: EXCISION OF PATELLA FRAGMENT, PATELLA TENDON ADVANCEMENT;  Surgeon: Vickki Hearing, MD;  Location: [facility];  Service: Orthopedics;  Laterality: Left;   Patient Active Problem List   Diagnosis Date Noted   Closed nondisplaced transverse fracture of patella with routine healing 01/29/2023   Patellar tendon rupture, left, subsequent encounter repair 01/27/23 01/29/2023   Eczema 05/29/2015   Sports physical 08/21/2013   Prolonged Q-T interval on ECG 08/21/2013   Bradycardia 08/21/2013   Well child check 08/18/2013   Need for prophylactic vaccination and inoculation against influenza 08/18/2013   BMI (body mass index), pediatric, 5% to less than 85% for age 49/24/2014   Acne 08/18/2013    PCP: NA  REFERRING PROVIDER: Vickki Hearing MD  REFERRING DIAG:  S82.035D (ICD-10-CM) - Closed nondisplaced transverse fracture of left patella with routine healing, subsequent encounter  S86.812D (ICD-10-CM) - Patellar tendon rupture, left, subsequent encounter    THERAPY DIAG:  Closed nondisplaced transverse fracture  of left patella with routine healing  Decreased range of motion (ROM) of left knee  Muscle weakness (generalized)  Rationale for Evaluation and Treatment: Rehabilitation  ONSET DATE: 01/15/2023  SUBJECTIVE:   SUBJECTIVE STATEMENT: Pt arrived with brace, no reports of pain.   PERTINENT HISTORY: ORIF Patella fracture Patellar Tendon Tear PAIN:  Are you having pain? No "sometimes some popping"  PRECAUTIONS: Other: Locked brace @ 60 degrees. Brace and PT  advancement as tolerated.   WEIGHT BEARING RESTRICTIONS:  WBAT  FALLS:  Has patient fallen in last 6 months? No  LIVING ENVIRONMENT: Lives with: lives with their spouse Lives in: House/apartment Stairs:  No stairs currently since living at [facility].  Has following equipment at home: None  OCCUPATION: "nothing right now"  PLOF: Independent  PATIENT GOALS: "walk normal"  NEXT MD VISIT: 04/01/2023  OBJECTIVE:   DIAGNOSTIC FINDINGS:   PATIENT SURVEYS:  LEFS 40/80  COGNITION: Overall cognitive status: Within functional limits for tasks assessed     SENSATION: WFL  POSTURE: No Significant postural limitations  PALPATION: Mild TTP along lateral L knee midline lateral to incision  LOWER EXTREMITY ROM:  Active ROM Right eval Left eval Left  03/08/23 Left 03/23/23  Hip flexion      Hip extension      Hip abduction      Hip adduction      Hip internal rotation      Hip external rotation      Knee flexion 55 135 50 AAROM 70 AROM 95  Knee extension 0 0 2 hyperextension   Ankle dorsiflexion  Ankle plantarflexion      Ankle inversion      Ankle eversion       (Blank rows = not tested)  LOWER EXTREMITY MMT:  MMT Right eval Left eval  Hip flexion 4- 2+(pain & L knee)  Hip extension    Hip abduction 4-   Hip adduction    Hip internal rotation    Hip external rotation    Knee flexion    Knee extension 5 1  Ankle dorsiflexion    Ankle plantarflexion    Ankle inversion    Ankle eversion      (Blank rows = not tested)   FUNCTIONAL TESTS:  2 minute walk test: 265 ft  GAIT: Distance walked: 26ft Assistive device utilized: None Level of assistance: Complete Independence Comments: Antalgic gait pattern with LLE circumduction. W/ knee brace locked @ 60 degrees.    TODAY'S TREATMENT:                                                                                                                              DATE:  03/23/23: Adjusted brace continued locked at 0 to 110 degrees flexion Gait training x 226 cueing for heel strike and equal stance phase 5 min rocking on bike seat 17 TKE GTB 20x Squat 2x 10 Heel raises 20x  Supine: SLR with quad set 10x, cueing for eccentric control quad sets. No extension lag Heel slide 10x  AROM 0-95 degrees Bridge 10   03/15/2023  -7 mins recumbent bike; AAROM with RLE. -SAQ with towel x 20 w/ 5sec cue with 5 sec on/off. [place] estim ( utilized as component.) -Mini squats on elevated bench 2 x 10; cues for L weight shift -TKE with RTB x 20  03/12/2023  -Heel slides 5 x 10' hold with L strap; self massaging tender/tight areas.  -Recumbent bike rotations 7'; brace off -Quad sets 15 x 10' with towels underneath knee.  -Sidelying hip abduction 2lb ankle weight 2 x 15lb   03/08/23 Heel slides 10 x 10 second holds with strap L Quad set 2 x 5 with 10 second holds L Sidelying hip abduction 2 x 15 L Prone hip extension 2 x 15 L Standing HR 1 x 20 Standing TR 1 x 20 Mini squat 2 x 10   EVAL: PT Evaluation, attendance policy, and HEP    PATIENT EDUCATION:  Education details:03/12/2023 Using towel underneath for quad set.  Education method: Medical illustrator Education comprehension: verbalized understanding and returned demonstration  HOME EXERCISE PROGRAM: Access Code: [email or web address] URL: [email or web address]  03/08/23- Standing Heel Raises  - 3 x daily - 7 x weekly - 2 sets - 20 reps - Toe Raises with  Counter Support  - 3 x daily - 7 x weekly - 2 sets - 20 reps - Mini Squat with Counter Support  - 3 x daily - 7 x weekly - 3 sets - 10 reps  Date: 03/05/2023 - Sidelying Hip  Abduction  - 1 x daily - 7 x weekly - 3 sets - 10 reps - Sidelying Hip Adduction  - 1 x daily - 7 x weekly - 3 sets - 10 reps - Prone Hip Extension  - 1 x daily - 7 x weekly - 3 sets - 10 reps - Supine Quadricep Sets  - 1 x daily - 7 x weekly - 3 sets - 10 reps - Supine Heel Slide  - 1 x daily - 7 x weekly - 3 sets - 10 reps - Long Sitting Ankle Plantar Flexion with Resistance  - 1 x daily - 7 x weekly - 3 sets - 10 reps - Long Sitting Ankle Dorsiflexion with Anchored Resistance  - 1 x daily - 7 x weekly - 3 sets - 10 reps - Long Sitting Ankle Inversion with Resistance  - 1 x daily - 7 x weekly - 3 sets - 10 reps - Long Sitting Ankle Eversion with Resistance  - 1 x daily - 7 x weekly - 3 sets - 10 reps  ASSESSMENT:  CLINICAL IMPRESSION: Pt at 8 week post-op.  Pt able to presents with good quad control noted with no extension lag during SLR.  Session focus per 8 week patella tendon repair protocol.  Therapist adjusted brace with vast improvements with heel to toe mechanics.  Session focus with quad and proximal mm strengthening.  Min cueing to improve weight bearing during squats with good follow thru.  Improved AROM 0-95 degrees.    OBJECTIVE IMPAIRMENTS: Abnormal gait, decreased mobility, decreased ROM, decreased strength, hypomobility, impaired flexibility, and pain.   ACTIVITY LIMITATIONS: carrying, lifting, sitting, standing, squatting, sleeping, stairs, transfers, and locomotion level  PARTICIPATION LIMITATIONS: community activity, occupation, and yard work  PERSONAL FACTORS: Age are also affecting patient's functional outcome.   REHAB POTENTIAL: Excellent  CLINICAL DECISION MAKING: Stable/uncomplicated  EVALUATION COMPLEXITY: Low   GOALS: Goals reviewed with patient? No  SHORT TERM GOALS: Target date:  03/26/2023  Pt and caregivers will be independent with HEP in order to demonstrate participation in Physical Therapy POC.  Baseline: Goal status: INITIAL  2.  Pt will report 10% increase in subjective functional performance in order to demonstrate improved functional capacity.  Baseline: Pt reports 40% Goal status: INITIAL  LONG TERM GOALS: Target date: 04/16/2023  Pt will ambulate with nonantalgic symmetrical normal gait biomechanics for >500 feet in order to demonstrate increase in functional mobility. Baseline: See objective Goal status: INITIAL  2.  Pt will increase LLE MMT to +4/5 or equal to RLE MMT in order to demonstrate increased muscular strength gain. Baseline: See objective Goal status: INITIAL  3.  Pt will improve L knee range of motion to 120 degrees or equal to R knee range of motion to improve upright functional mobility. Baseline: See objective Goal status: INITIAL  4.  Pt will improve LFES score by >50% of original score in order to demonstrate improved functional use of LLE. Baseline: see objective Goal status: INITIAL  PLAN:  PT FREQUENCY: 2x/week  PT DURATION: 6 weeks  PLANNED INTERVENTIONS: Therapeutic exercises, Therapeutic activity, Neuromuscular re-education, Balance training, Gait training, Patient/Family education, Self Care, Joint mobilization, Joint manipulation, Stair training, Dry Needling, Electrical stimulation, Cryotherapy, scar mobilization, Manual therapy, and Re-evaluation  PLAN FOR NEXT SESSION: L knee range of motion, [place] e-stim for L quad activation, patellar mobilizations,  Starting from 6-12 Week [facility] Patellar Tendon protocol*  Becky Sax, LPTA/CLT; CBIS [phone]  Juel Burrow, PTA 03/23/2023, 2:35 PM

## 2023-03-24 ENCOUNTER — Encounter (HOSPITAL_COMMUNITY): Payer: Medicaid Other

## 2023-03-26 ENCOUNTER — Encounter (HOSPITAL_COMMUNITY): Payer: Self-pay

## 2023-03-26 ENCOUNTER — Ambulatory Visit (HOSPITAL_COMMUNITY): Payer: Medicaid Other

## 2023-03-26 DIAGNOSIS — M6281 Muscle weakness (generalized): Secondary | ICD-10-CM | POA: Diagnosis not present

## 2023-03-26 DIAGNOSIS — M25662 Stiffness of left knee, not elsewhere classified: Secondary | ICD-10-CM | POA: Diagnosis not present

## 2023-03-26 DIAGNOSIS — S82035D Nondisplaced transverse fracture of left patella, subsequent encounter for closed fracture with routine healing: Secondary | ICD-10-CM

## 2023-03-26 DIAGNOSIS — S86812D Strain of other muscle(s) and tendon(s) at lower leg level, left leg, subsequent encounter: Secondary | ICD-10-CM | POA: Diagnosis not present

## 2023-03-26 NOTE — Therapy (Signed)
OUTPATIENT PHYSICAL THERAPY TREATMENT   Patient Name: Frederick Stewart MRN: [id number] DOB:[birth year], 22 y.o., male Today's Date: 03/29/2023  END OF SESSION:   03/26/23 1507  PT Visits / Re-Eval  Visit Number 6  Number of Visits 12  Date for PT Re-Evaluation 04/16/23  Authorization  Authorization Type medicaid [employer]  Authorization Time Period approved 12 visits from 03/02/23-06/02/2023  Authorization - Visit Number 5  Authorization - Number of Visits 12  Progress Note Due on Visit 10  PT Time Calculation  PT Start Time 1440  PT Stop Time 1525  PT Time Calculation (min) 45 min  PT - End of Session  Activity Tolerance Patient tolerated treatment well  Behavior During Therapy [facility] for tasks assessed/performed     History reviewed. No pertinent past medical history. Past Surgical History:  Procedure Laterality Date   ORIF PATELLA Left 01/27/2023   Procedure: EXCISION OF PATELLA FRAGMENT, PATELLA TENDON ADVANCEMENT;  Surgeon: Vickki Hearing, MD;  Location: [facility];  Service: Orthopedics;  Laterality: Left;   Patient Active Problem List   Diagnosis Date Noted   Closed nondisplaced transverse fracture of patella with routine healing 01/29/2023   Patellar tendon rupture, left, subsequent encounter repair 01/27/23 01/29/2023   Eczema 05/29/2015   Sports physical 08/21/2013   Prolonged Q-T interval on ECG 08/21/2013   Bradycardia 08/21/2013   Well child check 08/18/2013   Need for prophylactic vaccination and inoculation against influenza 08/18/2013   BMI (body mass index), pediatric, 5% to less than 85% for age 10/18/2013   Acne 08/18/2013    PCP: NA  REFERRING PROVIDER: Vickki Hearing MD  REFERRING DIAG:  S82.035D (ICD-10-CM) - Closed nondisplaced transverse fracture of left patella with routine healing, subsequent encounter  S86.812D (ICD-10-CM) - Patellar tendon rupture, left, subsequent encounter    THERAPY DIAG:  Closed nondisplaced transverse fracture of  left patella with routine healing  Decreased range of motion (ROM) of left knee  Muscle weakness (generalized)  Rationale for Evaluation and Treatment: Rehabilitation  ONSET DATE: 01/15/2023  SUBJECTIVE:   SUBJECTIVE STATEMENT: Pt arrived with brace, no reports of pain.  Stated he was a little sore following last session for maybe a day.  Feels knee is looser.     PERTINENT HISTORY: ORIF Patella fracture Patellar Tendon Tear PAIN:  Are you having pain? No "sometimes some popping"  PRECAUTIONS: Other: Locked brace @ 60 degrees. Brace and PT  advancement as tolerated.   WEIGHT BEARING RESTRICTIONS:  WBAT  FALLS:  Has patient fallen in last 6 months? No  LIVING ENVIRONMENT: Lives with: lives with their spouse Lives in: House/apartment Stairs:  No stairs currently since living at [facility].  Has following equipment at home: None  OCCUPATION: "nothing right now"  PLOF: Independent  PATIENT GOALS: "walk normal"  NEXT MD VISIT: 04/01/2023  OBJECTIVE:   DIAGNOSTIC FINDINGS:   PATIENT SURVEYS:  LEFS 40/80  COGNITION: Overall cognitive status: Within functional limits for tasks assessed     SENSATION: WFL  POSTURE: No Significant postural limitations  PALPATION: Mild TTP along lateral L knee midline lateral to incision  LOWER EXTREMITY ROM:  Active ROM Right eval Left eval Left  03/08/23 Left 03/23/23 Left 03/26/23  Hip flexion       Hip extension       Hip abduction       Hip adduction       Hip internal rotation       Hip external rotation  Knee flexion 55 135 50 AAROM 70 AROM  95 AROM  103  Knee extension 0 0 2 hyperextension    Ankle dorsiflexion       Ankle plantarflexion       Ankle inversion       Ankle eversion        (Blank rows = not tested)  LOWER EXTREMITY MMT:  MMT Right eval Left eval  Hip flexion 4- 2+(pain & L knee)  Hip extension    Hip abduction 4-   Hip adduction    Hip internal rotation    Hip external rotation     Knee flexion    Knee extension 5 1  Ankle dorsiflexion    Ankle plantarflexion    Ankle inversion    Ankle eversion     (Blank rows = not tested)   FUNCTIONAL TESTS:  2 minute walk test: 265 ft  GAIT: Distance walked: 228ft Assistive device utilized: None Level of assistance: Complete Independence Comments: Antalgic gait pattern with LLE circumduction. W/ knee brace locked @ 60 degrees.    TODAY'S TREATMENT:                                                                                                                              DATE:  03/26/23: Squat to Heel raises 20x 5 min initial rocking, reverse then full forward revolution on bike seat 15 Knee drive on 16XW step 9U04 " for flexion TKE 2 then 3PL 10x 5"  Prone: Knee flexion Quad stretch 2x 30" with rope  Supine: Heel slide AROM 0-103 degrees SLR 10x  03/23/23: Adjusted brace continued locked at 0 to 110 degrees flexion Gait training x 226 cueing for heel strike and equal stance phase 5 min rocking on bike seat 17 TKE GTB 20x Squat 2x 10 Heel raises 20x  Supine: SLR with quad set 10x, cueing for eccentric control quad sets. No extension lag Heel slide 10x  AROM 0-95 degrees Bridge 10   03/15/2023  -7 mins recumbent bike; AAROM with RLE. -SAQ with towel x 20 w/ 5sec cue with 5 sec on/off. [place] estim ( utilized as component.) -Mini squats on elevated bench 2 x 10; cues for L weight shift -TKE with RTB x 20  03/12/2023  -Heel slides 5 x 10' hold with L strap; self massaging tender/tight areas.  -Recumbent bike rotations 7'; brace off -Quad sets 15 x 10' with towels underneath knee.  -Sidelying hip abduction 2lb ankle weight 2 x 15lb   03/08/23 Heel slides 10 x 10 second holds with strap L Quad set 2 x 5 with 10 second holds L Sidelying hip abduction 2 x 15 L Prone hip extension 2 x 15 L Standing HR 1 x 20 Standing TR 1 x 20 Mini squat 2 x 10   EVAL: PT Evaluation, attendance policy,  and HEP    PATIENT EDUCATION:  Education details:03/12/2023 Using towel underneath for quad set.  Education  method: Explanation and Demonstration Education comprehension: verbalized understanding and returned demonstration  HOME EXERCISE PROGRAM: Access Code: [email or web address] URL: [email or web address]  03/08/23- Standing Heel Raises  - 3 x daily - 7 x weekly - 2 sets - 20 reps - Toe Raises with Counter Support  - 3 x daily - 7 x weekly - 2 sets - 20 reps - Mini Squat with Counter Support  - 3 x daily - 7 x weekly - 3 sets - 10 reps  Date: 03/05/2023 - Sidelying Hip Abduction  - 1 x daily - 7 x weekly - 3 sets - 10 reps - Sidelying Hip Adduction  - 1 x daily - 7 x weekly - 3 sets - 10 reps - Prone Hip Extension  - 1 x daily - 7 x weekly - 3 sets - 10 reps - Supine Quadricep Sets  - 1 x daily - 7 x weekly - 3 sets - 10 reps - Supine Heel Slide  - 1 x daily - 7 x weekly - 3 sets - 10 reps - Long Sitting Ankle Plantar Flexion with Resistance  - 1 x daily - 7 x weekly - 3 sets - 10 reps - Long Sitting Ankle Dorsiflexion with Anchored Resistance  - 1 x daily - 7 x weekly - 3 sets - 10 reps - Long Sitting Ankle Inversion with Resistance  - 1 x daily - 7 x weekly - 3 sets - 10 reps - Long Sitting Ankle Eversion with Resistance  - 1 x daily - 7 x weekly - 3 sets - 10 reps  ASSESSMENT:  CLINICAL IMPRESSION: Session focus with quad strengthening and knee mobility per patella tendon repair protocol.  Added stretches for mobility with ability to complete full revolution on bike following initial rocking.  Adjusted brace to 120 degrees flexion to improve gait mechanics with extension lacked at 0.  Pt able to complete SLR with no extension lag.  Presents with equal weight bearing during squats with no cueing required this session.  AROM improved to 0-103 degrees.  OBJECTIVE IMPAIRMENTS: Abnormal gait, decreased mobility, decreased ROM, decreased strength, hypomobility, impaired flexibility, and  pain.   ACTIVITY LIMITATIONS: carrying, lifting, sitting, standing, squatting, sleeping, stairs, transfers, and locomotion level  PARTICIPATION LIMITATIONS: community activity, occupation, and yard work  PERSONAL FACTORS: Age are also affecting patient's functional outcome.   REHAB POTENTIAL: Excellent  CLINICAL DECISION MAKING: Stable/uncomplicated  EVALUATION COMPLEXITY: Low   GOALS: Goals reviewed with patient? No  SHORT TERM GOALS: Target date: 03/26/2023  Pt and caregivers will be independent with HEP in order to demonstrate participation in Physical Therapy POC.  Baseline: Goal status: INITIAL  2.  Pt will report 10% increase in subjective functional performance in order to demonstrate improved functional capacity.  Baseline: Pt reports 40% Goal status: INITIAL  LONG TERM GOALS: Target date: 04/16/2023  Pt will ambulate with nonantalgic symmetrical normal gait biomechanics for >500 feet in order to demonstrate increase in functional mobility. Baseline: See objective Goal status: INITIAL  2.  Pt will increase LLE MMT to +4/5 or equal to RLE MMT in order to demonstrate increased muscular strength gain. Baseline: See objective Goal status: INITIAL  3.  Pt will improve L knee range of motion to 120 degrees or equal to R knee range of motion to improve upright functional mobility. Baseline: See objective Goal status: INITIAL  4.  Pt will improve LFES score by >50% of original score in order to demonstrate improved functional use of LLE. Baseline: see  objective Goal status: INITIAL  PLAN:  PT FREQUENCY: 2x/week  PT DURATION: 6 weeks  PLANNED INTERVENTIONS: Therapeutic exercises, Therapeutic activity, Neuromuscular re-education, Balance training, Gait training, Patient/Family education, Self Care, Joint mobilization, Joint manipulation, Stair training, Dry Needling, Electrical stimulation, Cryotherapy, scar mobilization, Manual therapy, and Re-evaluation  PLAN FOR  NEXT SESSION: L knee range of motion, [place] e-stim for L quad activation, patellar mobilizations,  Starting from 6-12 Week [facility] Patellar Tendon protocol*  Becky Sax, LPTA/CLT; CBIS [phone]  Juel Burrow, PTA 03/29/2023, 8:11 AM

## 2023-03-30 ENCOUNTER — Encounter (HOSPITAL_COMMUNITY): Payer: Self-pay

## 2023-03-30 ENCOUNTER — Ambulatory Visit (HOSPITAL_COMMUNITY): Payer: Medicaid Other | Attending: Orthopedic Surgery

## 2023-03-30 DIAGNOSIS — M6281 Muscle weakness (generalized): Secondary | ICD-10-CM | POA: Diagnosis not present

## 2023-03-30 DIAGNOSIS — M25662 Stiffness of left knee, not elsewhere classified: Secondary | ICD-10-CM

## 2023-03-30 DIAGNOSIS — S82035D Nondisplaced transverse fracture of left patella, subsequent encounter for closed fracture with routine healing: Secondary | ICD-10-CM

## 2023-03-30 NOTE — Therapy (Signed)
OUTPATIENT PHYSICAL THERAPY TREATMENT   Patient Name: Frederick Stewart MRN: [id number] DOB:12-06-2000, 22 y.o., male Today's Date: 03/30/2023  END OF SESSION: END OF SESSION:   PT End of Session - 03/30/23 1548     Visit Number 7    Number of Visits 12    Date for PT Re-Evaluation 04/16/23    Authorization Type medicaid [employer]    Authorization Time Period approved 12 visits from 03/02/23-06/02/2023    Authorization - Visit Number 6    Authorization - Number of Visits 12    Progress Note Due on Visit 10    PT Start Time 1516    PT Stop Time 1554    PT Time Calculation (min) 38 min    Activity Tolerance Patient tolerated treatment well    Behavior During Therapy [facility] for tasks assessed/performed              History reviewed. No pertinent past medical history. Past Surgical History:  Procedure Laterality Date   ORIF PATELLA Left 01/27/2023   Procedure: EXCISION OF PATELLA FRAGMENT, PATELLA TENDON ADVANCEMENT;  Surgeon: Vickki Hearing, MD;  Location: [facility];  Service: Orthopedics;  Laterality: Left;   Patient Active Problem List   Diagnosis Date Noted   Closed nondisplaced transverse fracture of patella with routine healing 01/29/2023   Patellar tendon rupture, left, subsequent encounter repair 01/27/23 01/29/2023   Eczema 05/29/2015   Sports physical 08/21/2013   Prolonged Q-T interval on ECG 08/21/2013   Bradycardia 08/21/2013   Well child check 08/18/2013   Need for prophylactic vaccination and inoculation against influenza 08/18/2013   BMI (body mass index), pediatric, 5% to less than 85% for age 62/24/2014   Acne 08/18/2013    PCP: NA  REFERRING PROVIDER: Vickki Hearing MD  REFERRING DIAG:  S82.035D (ICD-10-CM) - Closed nondisplaced transverse fracture of left patella with routine healing, subsequent encounter  S86.812D (ICD-10-CM) - Patellar tendon rupture, left, subsequent encounter    THERAPY DIAG:  Closed nondisplaced transverse fracture of left  patella with routine healing  Muscle weakness (generalized)  Decreased range of motion (ROM) of left knee  Rationale for Evaluation and Treatment: Rehabilitation  ONSET DATE: 01/15/2023  SUBJECTIVE:   SUBJECTIVE STATEMENT: Pt reporting no pain, improved ROM and overall doing better. Has tried walking without brace at home.     PERTINENT HISTORY: ORIF Patella fracture Patellar Tendon Tear PAIN:  Are you having pain? No "sometimes some popping"  PRECAUTIONS: Other: Locked brace @ 60 degrees. Brace and PT  advancement as tolerated.   WEIGHT BEARING RESTRICTIONS:  WBAT  FALLS:  Has patient fallen in last 6 months? No  LIVING ENVIRONMENT: Lives with: lives with their spouse Lives in: House/apartment Stairs:  No stairs currently since living at [facility].  Has following equipment at home: None  OCCUPATION: "nothing right now"  PLOF: Independent  PATIENT GOALS: "walk normal"  NEXT MD VISIT: 04/01/2023  OBJECTIVE:   DIAGNOSTIC FINDINGS:   PATIENT SURVEYS:  LEFS 40/80  COGNITION: Overall cognitive status: Within functional limits for tasks assessed     SENSATION: WFL  POSTURE: No Significant postural limitations  PALPATION: Mild TTP along lateral L knee midline lateral to incision  LOWER EXTREMITY ROM:  Active ROM Right eval Left eval Left  03/08/23 Left 03/23/23  Hip flexion      Hip extension      Hip abduction      Hip adduction      Hip internal rotation  Hip external rotation      Knee flexion 55 135 50 AAROM 70 AROM 95  Knee extension 0 0 2 hyperextension   Ankle dorsiflexion      Ankle plantarflexion      Ankle inversion      Ankle eversion       (Blank rows = not tested)  LOWER EXTREMITY MMT:  MMT Right eval Left eval  Hip flexion 4- 2+(pain & L knee)  Hip extension    Hip abduction 4-   Hip adduction    Hip internal rotation    Hip external rotation    Knee flexion    Knee extension 5 1  Ankle dorsiflexion    Ankle  plantarflexion    Ankle inversion    Ankle eversion     (Blank rows = not tested)   FUNCTIONAL TESTS:  2 minute walk test: 265 ft  GAIT: Distance walked: 216ft Assistive device utilized: None Level of assistance: Complete Independence Comments: Antalgic gait pattern with LLE circumduction. W/ knee brace locked @ 60 degrees.    TODAY'S TREATMENT:                                                                                                                              DATE:  03/30/2023  -Recumbent Bike x7; achieving full rotations. Seat 14 -2in stepups with RUE support x 10;  -Blue TB TKE x 30 -Mini wall squats 15 x 5' hold  -Standing knee flexion w/ 2lb ankle weight 2 x 12 repetitions.  -Standing hip abduction 1 x 20 repetitions.  -60ft gait training. Cues for continue L weight shift and knee flexion during swing.   03/26/23: Squat to Heel raises 20x 5 min initial rocking, reverse then full forward revolution on bike seat 15 Knee drive on 16XW step 9U04 " for flexion TKE 2 then 3PL 10x 5"  Prone: Knee flexion Quad stretch 2x 30" with rope  Supine: Heel slide AROM 0-103 degrees SLR 10x  03/23/23: Adjusted brace continued locked at 0 to 110 degrees flexion Gait training x 226 cueing for heel strike and equal stance phase 5 min rocking on bike seat 17 TKE GTB 20x Squat 2x 10 Heel raises 20x  Supine: SLR with quad set 10x, cueing for eccentric control quad sets. No extension lag Heel slide 10x  AROM 0-95 degrees Bridge 10   03/15/2023  -7 mins recumbent bike; AAROM with RLE. -SAQ with towel x 20 w/ 5sec cue with 5 sec on/off. [place] estim ( utilized as component.) -Mini squats on elevated bench 2 x 10; cues for L weight shift -TKE with RTB x 20  03/12/2023  -Heel slides 5 x 10' hold with L strap; self massaging tender/tight areas.  -Recumbent bike rotations 7'; brace off -Quad sets 15 x 10' with towels underneath knee.  -Sidelying hip abduction 2lb  ankle weight 2 x 15lb   03/08/23 Heel slides 10 x 10 second holds with  strap L Quad set 2 x 5 with 10 second holds L Sidelying hip abduction 2 x 15 L Prone hip extension 2 x 15 L Standing HR 1 x 20 Standing TR 1 x 20 Mini squat 2 x 10   EVAL: PT Evaluation, attendance policy, and HEP    PATIENT EDUCATION:  Education details:03/12/2023 Using towel underneath for quad set.  Education method: Medical illustrator Education comprehension: verbalized understanding and returned demonstration  HOME EXERCISE PROGRAM: Access Code: [email or web address] URL: [email or web address]  03/08/23- Standing Heel Raises  - 3 x daily - 7 x weekly - 2 sets - 20 reps - Toe Raises with Counter Support  - 3 x daily - 7 x weekly - 2 sets - 20 reps - Mini Squat with Counter Support  - 3 x daily - 7 x weekly - 3 sets - 10 reps  Date: 03/05/2023 - Sidelying Hip Abduction  - 1 x daily - 7 x weekly - 3 sets - 10 reps - Sidelying Hip Adduction  - 1 x daily - 7 x weekly - 3 sets - 10 reps - Prone Hip Extension  - 1 x daily - 7 x weekly - 3 sets - 10 reps - Supine Quadricep Sets  - 1 x daily - 7 x weekly - 3 sets - 10 reps - Supine Heel Slide  - 1 x daily - 7 x weekly - 3 sets - 10 reps - Long Sitting Ankle Plantar Flexion with Resistance  - 1 x daily - 7 x weekly - 3 sets - 10 reps - Long Sitting Ankle Dorsiflexion with Anchored Resistance  - 1 x daily - 7 x weekly - 3 sets - 10 reps - Long Sitting Ankle Inversion with Resistance  - 1 x daily - 7 x weekly - 3 sets - 10 reps - Long Sitting Ankle Eversion with Resistance  - 1 x daily - 7 x weekly - 3 sets - 10 reps  ASSESSMENT:  CLINICAL IMPRESSION: Progressing well this session. Continues to improve ROM. Improved quad control this session as well with visually improved muscle hypertrophy. Showing continue quad weakness with heavy compensations during 2in stepups. Pt will continue benefit from skilled physical therapy services to return to prior PLOF  and functional status.   Session focus with quad strengthening and knee mobility per patella tendon repair protocol.  Added stretches for mobility with ability to complete full revolution on bike following initial rocking.  Adjusted brace to 120 degrees flexion to improve gait mechanics with extension lacked at 0.  Pt able to complete SLR with no extension lag.  Presents with equal weight bearing during squats with no cueing required this session.  AROM improved to 0-103 degrees.  OBJECTIVE IMPAIRMENTS: Abnormal gait, decreased mobility, decreased ROM, decreased strength, hypomobility, impaired flexibility, and pain.   ACTIVITY LIMITATIONS: carrying, lifting, sitting, standing, squatting, sleeping, stairs, transfers, and locomotion level  PARTICIPATION LIMITATIONS: community activity, occupation, and yard work  PERSONAL FACTORS: Age are also affecting patient's functional outcome.   REHAB POTENTIAL: Excellent  CLINICAL DECISION MAKING: Stable/uncomplicated  EVALUATION COMPLEXITY: Low   GOALS: Goals reviewed with patient? No  SHORT TERM GOALS: Target date: 03/26/2023  Pt and caregivers will be independent with HEP in order to demonstrate participation in Physical Therapy POC.  Baseline: Goal status: INITIAL  2.  Pt will report 10% increase in subjective functional performance in order to demonstrate improved functional capacity.  Baseline: Pt reports 40% Goal status: INITIAL  LONG TERM GOALS: Target  date: 04/16/2023  Pt will ambulate with nonantalgic symmetrical normal gait biomechanics for >500 feet in order to demonstrate increase in functional mobility. Baseline: See objective Goal status: INITIAL  2.  Pt will increase LLE MMT to +4/5 or equal to RLE MMT in order to demonstrate increased muscular strength gain. Baseline: See objective Goal status: INITIAL  3.  Pt will improve L knee range of motion to 120 degrees or equal to R knee range of motion to improve upright  functional mobility. Baseline: See objective Goal status: INITIAL  4.  Pt will improve LFES score by >50% of original score in order to demonstrate improved functional use of LLE. Baseline: see objective Goal status: INITIAL  PLAN:  PT FREQUENCY: 2x/week  PT DURATION: 6 weeks  PLANNED INTERVENTIONS: Therapeutic exercises, Therapeutic activity, Neuromuscular re-education, Balance training, Gait training, Patient/Family education, Self Care, Joint mobilization, Joint manipulation, Stair training, Dry Needling, Electrical stimulation, Cryotherapy, scar mobilization, Manual therapy, and Re-evaluation  PLAN FOR NEXT SESSION: L knee range of motion, [place] e-stim for L quad activation, patellar mobilizations,  Starting from 6-12 Week [facility] Patellar Tendon protocol*  Becky Sax, LPTA/CLT; CBIS [phone]  Nelida Meuse, PT 03/30/2023, 3:55 PM

## 2023-04-01 ENCOUNTER — Encounter: Payer: Self-pay | Admitting: Orthopedic Surgery

## 2023-04-01 ENCOUNTER — Ambulatory Visit (INDEPENDENT_AMBULATORY_CARE_PROVIDER_SITE_OTHER): Payer: Medicaid Other | Admitting: Orthopedic Surgery

## 2023-04-01 ENCOUNTER — Encounter (HOSPITAL_COMMUNITY): Payer: Medicaid Other

## 2023-04-01 VITALS — BP 120/73 | HR 89 | Ht 70.0 in | Wt 160.0 lb

## 2023-04-01 DIAGNOSIS — S82035D Nondisplaced transverse fracture of left patella, subsequent encounter for closed fracture with routine healing: Secondary | ICD-10-CM

## 2023-04-01 NOTE — Progress Notes (Signed)
   This is a follow-up appointment  POST OP VISIT # 4   POD # 64  Encounter Diagnosis  Name Primary?   Closed nondisplaced transverse fracture of left patella with routine healing, subsequent encounter Yes     Chief Complaint  Patient presents with   Post-op Follow-up    Left patellar tendon repair 01/27/23    PT appts  50 AAROM 70 AROM  95 AROM  103     He's getting better motion now  His extension actively is to 0  He can start brace weaning in the house   Continue PT fu in 5 weeks

## 2023-04-06 ENCOUNTER — Ambulatory Visit (HOSPITAL_COMMUNITY): Payer: Medicaid Other

## 2023-04-06 ENCOUNTER — Encounter (HOSPITAL_COMMUNITY): Payer: Self-pay

## 2023-04-06 DIAGNOSIS — M25662 Stiffness of left knee, not elsewhere classified: Secondary | ICD-10-CM

## 2023-04-06 DIAGNOSIS — M6281 Muscle weakness (generalized): Secondary | ICD-10-CM

## 2023-04-06 DIAGNOSIS — S82035D Nondisplaced transverse fracture of left patella, subsequent encounter for closed fracture with routine healing: Secondary | ICD-10-CM

## 2023-04-06 NOTE — Therapy (Signed)
OUTPATIENT PHYSICAL THERAPY TREATMENT   Patient Name: Frederick Stewart MRN: [id number] DOB:10-13-2001, 22 y.o., male Today's Date: 04/06/2023  END OF SESSION: END OF SESSION:   PT End of Session - 04/06/23 1554     Visit Number 8    Number of Visits 12    Date for PT Re-Evaluation 04/16/23    Authorization Type medicaid [employer]    Authorization Time Period approved 12 visits from 03/02/23-06/02/2023    Authorization - Visit Number 7    Authorization - Number of Visits 12    Progress Note Due on Visit 10    PT Start Time 1516    PT Stop Time 1557    PT Time Calculation (min) 41 min    Activity Tolerance Patient tolerated treatment well    Behavior During Therapy [facility] for tasks assessed/performed               History reviewed. No pertinent past medical history. Past Surgical History:  Procedure Laterality Date   ORIF PATELLA Left 01/27/2023   Procedure: EXCISION OF PATELLA FRAGMENT, PATELLA TENDON ADVANCEMENT;  Surgeon: Vickki Hearing, MD;  Location: [facility];  Service: Orthopedics;  Laterality: Left;   Patient Active Problem List   Diagnosis Date Noted   Closed nondisplaced transverse fracture of patella with routine healing 01/29/2023   Patellar tendon rupture, left, subsequent encounter repair 01/27/23 01/29/2023   Eczema 05/29/2015   Sports physical 08/21/2013   Prolonged Q-T interval on ECG 08/21/2013   Bradycardia 08/21/2013   Well child check 08/18/2013   Need for prophylactic vaccination and inoculation against influenza 08/18/2013   BMI (body mass index), pediatric, 5% to less than 85% for age 23/24/2014   Acne 08/18/2013    PCP: NA  REFERRING PROVIDER: Vickki Hearing MD  REFERRING DIAG:  S82.035D (ICD-10-CM) - Closed nondisplaced transverse fracture of left patella with routine healing, subsequent encounter  S86.812D (ICD-10-CM) - Patellar tendon rupture, left, subsequent encounter    THERAPY DIAG:  Closed nondisplaced transverse fracture of  left patella with routine healing  Muscle weakness (generalized)  Decreased range of motion (ROM) of left knee  Rationale for Evaluation and Treatment: Rehabilitation  ONSET DATE: 01/15/2023  SUBJECTIVE:   SUBJECTIVE STATEMENT: Pt walking in without brace. Reporting had MD followup and that MD was pleased with progress.     PERTINENT HISTORY: ORIF Patella fracture Patellar Tendon Tear PAIN:  Are you having pain? No "sometimes some popping"  PRECAUTIONS: Other: Locked brace @ 60 degrees. Brace and PT  advancement as tolerated.   WEIGHT BEARING RESTRICTIONS:  WBAT  FALLS:  Has patient fallen in last 6 months? No  LIVING ENVIRONMENT: Lives with: lives with their spouse Lives in: House/apartment Stairs:  No stairs currently since living at [facility].  Has following equipment at home: None  OCCUPATION: "nothing right now"  PLOF: Independent  PATIENT GOALS: "walk normal"  NEXT MD VISIT: 04/01/2023  OBJECTIVE:   DIAGNOSTIC FINDINGS:   PATIENT SURVEYS:  LEFS 40/80  COGNITION: Overall cognitive status: Within functional limits for tasks assessed     SENSATION: WFL  POSTURE: No Significant postural limitations  PALPATION: Mild TTP along lateral L knee midline lateral to incision  LOWER EXTREMITY ROM:  Active ROM Right eval Left eval Left  03/08/23 Left 03/23/23  Hip flexion      Hip extension      Hip abduction      Hip adduction      Hip internal rotation  Hip external rotation      Knee flexion 135 55 50 AAROM 70 AROM 95  Knee extension 0 0 2 hyperextension   Ankle dorsiflexion      Ankle plantarflexion      Ankle inversion      Ankle eversion       (Blank rows = not tested)  LOWER EXTREMITY MMT:  MMT Right eval Left eval  Hip flexion 4- 2+(pain & L knee)  Hip extension    Hip abduction 4-   Hip adduction    Hip internal rotation    Hip external rotation    Knee flexion    Knee extension 5 1  Ankle dorsiflexion    Ankle  plantarflexion    Ankle inversion    Ankle eversion     (Blank rows = not tested)   FUNCTIONAL TESTS:  2 minute walk test: 265 ft  GAIT: Distance walked: 243ft Assistive device utilized: None Level of assistance: Complete Independence Comments: Antalgic gait pattern with LLE circumduction. W/ knee brace locked @ 60 degrees.    TODAY'S TREATMENT:                                                                                                                              DATE:  04/06/2023  -Recumbent bike x 7'; seat 13; Full rotation. -45 degree wall sits w/ mirror for visual cues 10x 7-10' -2x 15 RTB standing hip adduction  -2x 15 RTB standing hip abduction -Heel raises; bilateral concentric, unilateral eccentric with LLE x 20.  -2in stepups w/o BUE support. x20 -2in stepdowns w/ single UE support. X20 -bodycraft TKE #1 plate x 12  02/27/980  -Recumbent Bike x7; achieving full rotations. Seat 14 -2in stepups with RUE support x 10;  -Blue TB TKE x 30 -Mini wall squats 15 x 5' hold  -Standing knee flexion w/ 2lb ankle weight 2 x 12 repetitions.  -Standing hip abduction 1 x 20 repetitions.  -22ft gait training. Cues for continue L weight shift and knee flexion during swing.   03/26/23: Squat to Heel raises 20x 5 min initial rocking, reverse then full forward revolution on bike seat 15 Knee drive on 19JY step 7W29 " for flexion TKE 2 then 3PL 10x 5"  Prone: Knee flexion Quad stretch 2x 30" with rope  Supine: Heel slide AROM 0-103 degrees SLR 10x  03/23/23: Adjusted brace continued locked at 0 to 110 degrees flexion Gait training x 226 cueing for heel strike and equal stance phase 5 min rocking on bike seat 17 TKE GTB 20x Squat 2x 10 Heel raises 20x  Supine: SLR with quad set 10x, cueing for eccentric control quad sets. No extension lag Heel slide 10x  AROM 0-95 degrees Bridge 10  03/15/2023  -7 mins recumbent bike; AAROM with RLE. -SAQ with towel x 20 w/ 5sec  cue with 5 sec on/off. [place] estim ( utilized as component.) -Mini squats on elevated bench 2 x  10; cues for L weight shift -TKE with RTB x 20  03/12/2023  -Heel slides 5 x 10' hold with L strap; self massaging tender/tight areas.  -Recumbent bike rotations 7'; brace off -Quad sets 15 x 10' with towels underneath knee.  -Sidelying hip abduction 2lb ankle weight 2 x 15lb  03/08/23 Heel slides 10 x 10 second holds with strap L Quad set 2 x 5 with 10 second holds L Sidelying hip abduction 2 x 15 L Prone hip extension 2 x 15 L Standing HR 1 x 20 Standing TR 1 x 20 Mini squat 2 x 10   EVAL: PT Evaluation, attendance policy, and HEP    PATIENT EDUCATION:  Education details:03/12/2023 Using towel underneath for quad set.  Education method: Medical illustrator Education comprehension: verbalized understanding and returned demonstration  HOME EXERCISE PROGRAM: Access Code: [email or web address] URL: [email or web address]  03/08/23- Standing Heel Raises  - 3 x daily - 7 x weekly - 2 sets - 20 reps - Toe Raises with Counter Support  - 3 x daily - 7 x weekly - 2 sets - 20 reps - Mini Squat with Counter Support  - 3 x daily - 7 x weekly - 3 sets - 10 reps  Date: 03/05/2023 - Sidelying Hip Abduction  - 1 x daily - 7 x weekly - 3 sets - 10 reps - Sidelying Hip Adduction  - 1 x daily - 7 x weekly - 3 sets - 10 reps - Prone Hip Extension  - 1 x daily - 7 x weekly - 3 sets - 10 reps - Supine Quadricep Sets  - 1 x daily - 7 x weekly - 3 sets - 10 reps - Supine Heel Slide  - 1 x daily - 7 x weekly - 3 sets - 10 reps - Long Sitting Ankle Plantar Flexion with Resistance  - 1 x daily - 7 x weekly - 3 sets - 10 reps - Long Sitting Ankle Dorsiflexion with Anchored Resistance  - 1 x daily - 7 x weekly - 3 sets - 10 reps - Long Sitting Ankle Inversion with Resistance  - 1 x daily - 7 x weekly - 3 sets - 10 reps - Long Sitting Ankle Eversion with Resistance  - 1 x daily - 7 x weekly -  3 sets - 10 reps  ASSESSMENT:  CLINICAL IMPRESSION: Pt tolerating session well with focus on continue heavy L quadriceps strengthening with CKC movements. Continues to improve ROM and strength. Progressing with 2in stepups this session. Shows mild knee instability with cues for proper knee flexion for eccentric control. Still compensates descending with weight shift on to RLE while L knee straight. Pt will continue benefit from skilled physical therapy services to return to prior PLOF and functional status.   OBJECTIVE IMPAIRMENTS: Abnormal gait, decreased mobility, decreased ROM, decreased strength, hypomobility, impaired flexibility, and pain.   ACTIVITY LIMITATIONS: carrying, lifting, sitting, standing, squatting, sleeping, stairs, transfers, and locomotion level  PARTICIPATION LIMITATIONS: community activity, occupation, and yard work  PERSONAL FACTORS: Age are also affecting patient's functional outcome.   REHAB POTENTIAL: Excellent  CLINICAL DECISION MAKING: Stable/uncomplicated  EVALUATION COMPLEXITY: Low   GOALS: Goals reviewed with patient? No  SHORT TERM GOALS: Target date: 03/26/2023  Pt and caregivers will be independent with HEP in order to demonstrate participation in Physical Therapy POC.  Baseline: Goal status: INITIAL  2.  Pt will report 10% increase in subjective functional performance in order to demonstrate improved functional capacity.  Baseline: Pt  reports 40% Goal status: INITIAL  LONG TERM GOALS: Target date: 04/16/2023  Pt will ambulate with nonantalgic symmetrical normal gait biomechanics for >500 feet in order to demonstrate increase in functional mobility. Baseline: See objective Goal status: INITIAL  2.  Pt will increase LLE MMT to +4/5 or equal to RLE MMT in order to demonstrate increased muscular strength gain. Baseline: See objective Goal status: INITIAL  3.  Pt will improve L knee range of motion to 120 degrees or equal to R knee range of  motion to improve upright functional mobility. Baseline: See objective Goal status: INITIAL  4.  Pt will improve LFES score by >50% of original score in order to demonstrate improved functional use of LLE. Baseline: see objective Goal status: INITIAL  PLAN:  PT FREQUENCY: 2x/week  PT DURATION: 6 weeks  PLANNED INTERVENTIONS: Therapeutic exercises, Therapeutic activity, Neuromuscular re-education, Balance training, Gait training, Patient/Family education, Self Care, Joint mobilization, Joint manipulation, Stair training, Dry Needling, Electrical stimulation, Cryotherapy, scar mobilization, Manual therapy, and Re-evaluation  PLAN FOR NEXT SESSION: L knee range of motion, [place] e-stim for L quad activation, patellar mobilizations,  Starting from 6-12 Week [facility] Patellar Tendon protocol*  Becky Sax, LPTA/CLT; CBIS [phone]  Nelida Meuse, PT 04/06/2023, 3:57 PM

## 2023-04-09 ENCOUNTER — Encounter (HOSPITAL_COMMUNITY): Payer: Self-pay

## 2023-04-09 ENCOUNTER — Ambulatory Visit (HOSPITAL_COMMUNITY): Payer: Medicaid Other

## 2023-04-09 DIAGNOSIS — M6281 Muscle weakness (generalized): Secondary | ICD-10-CM

## 2023-04-09 DIAGNOSIS — M25662 Stiffness of left knee, not elsewhere classified: Secondary | ICD-10-CM | POA: Diagnosis not present

## 2023-04-09 DIAGNOSIS — S82035D Nondisplaced transverse fracture of left patella, subsequent encounter for closed fracture with routine healing: Secondary | ICD-10-CM | POA: Diagnosis not present

## 2023-04-09 NOTE — Therapy (Signed)
OUTPATIENT PHYSICAL THERAPY TREATMENT   Patient Name: Frederick Stewart MRN: [id number] DOB:[birth year], 22 y.o., male Today's Date: 04/09/2023  END OF SESSION: END OF SESSION:   PT End of Session - 04/09/23 1516     Visit Number 9    Number of Visits 12    Date for PT Re-Evaluation 04/16/23    Authorization Type medicaid [employer]    Authorization Time Period approved 12 visits from 03/02/23-06/02/2023    Authorization - Visit Number 8    Authorization - Number of Visits 12    Progress Note Due on Visit 10    PT Start Time 1435    PT Stop Time 1515    PT Time Calculation (min) 40 min    Activity Tolerance Patient tolerated treatment well    Behavior During Therapy [facility] for tasks assessed/performed                History reviewed. No pertinent past medical history. Past Surgical History:  Procedure Laterality Date   ORIF PATELLA Left 01/27/2023   Procedure: EXCISION OF PATELLA FRAGMENT, PATELLA TENDON ADVANCEMENT;  Surgeon: Vickki Hearing, MD;  Location: [facility];  Service: Orthopedics;  Laterality: Left;   Patient Active Problem List   Diagnosis Date Noted   Closed nondisplaced transverse fracture of patella with routine healing 01/29/2023   Patellar tendon rupture, left, subsequent encounter repair 01/27/23 01/29/2023   Eczema 05/29/2015   Sports physical 08/21/2013   Prolonged Q-T interval on ECG 08/21/2013   Bradycardia 08/21/2013   Well child check 08/18/2013   Need for prophylactic vaccination and inoculation against influenza 08/18/2013   BMI (body mass index), pediatric, 5% to less than 85% for age 15/24/2014   Acne 08/18/2013    PCP: NA  REFERRING PROVIDER: Vickki Hearing MD  REFERRING DIAG:  S82.035D (ICD-10-CM) - Closed nondisplaced transverse fracture of left patella with routine healing, subsequent encounter  S86.812D (ICD-10-CM) - Patellar tendon rupture, left, subsequent encounter    THERAPY DIAG:  Closed nondisplaced transverse fracture of  left patella with routine healing  Muscle weakness (generalized)  Decreased range of motion (ROM) of left knee  Rationale for Evaluation and Treatment: Rehabilitation  ONSET DATE: 01/15/2023  SUBJECTIVE:   SUBJECTIVE STATEMENT: Pt reports no pain and reporting good HEP overall.  Pt asking ways to incorporate activities into the pool.    PERTINENT HISTORY: ORIF Patella fracture Patellar Tendon Tear PAIN:  Are you having pain? No "sometimes some popping"  PRECAUTIONS: Other: Locked brace @ 60 degrees. Brace and PT  advancement as tolerated.   WEIGHT BEARING RESTRICTIONS:  WBAT  FALLS:  Has patient fallen in last 6 months? No  LIVING ENVIRONMENT: Lives with: lives with their spouse Lives in: House/apartment Stairs:  No stairs currently since living at [facility].  Has following equipment at home: None  OCCUPATION: "nothing right now"  PLOF: Independent  PATIENT GOALS: "walk normal"  NEXT MD VISIT: 04/01/2023  OBJECTIVE:   DIAGNOSTIC FINDINGS:   PATIENT SURVEYS:  LEFS 40/80  COGNITION: Overall cognitive status: Within functional limits for tasks assessed     SENSATION: WFL  POSTURE: No Significant postural limitations  PALPATION: Mild TTP along lateral L knee midline lateral to incision  LOWER EXTREMITY ROM:  Active ROM Right eval Left eval Left  03/08/23 Left 03/23/23 Left 04/09/2023  Hip flexion       Hip extension       Hip abduction       Hip adduction  Hip internal rotation       Hip external rotation       Knee flexion 135 55 50 AAROM 70 AROM 95 110  Knee extension 0 0 2 hyperextension  2 hyperextension  Ankle dorsiflexion       Ankle plantarflexion       Ankle inversion       Ankle eversion        (Blank rows = not tested)  LOWER EXTREMITY MMT:  MMT Right eval Left eval  Hip flexion 4- 2+(pain & L knee)  Hip extension    Hip abduction 4-   Hip adduction    Hip internal rotation    Hip external rotation    Knee flexion     Knee extension 5 1  Ankle dorsiflexion    Ankle plantarflexion    Ankle inversion    Ankle eversion     (Blank rows = not tested)   FUNCTIONAL TESTS:  2 minute walk test: 265 ft  GAIT: Distance walked: 270ft Assistive device utilized: None Level of assistance: Complete Independence Comments: Antalgic gait pattern with LLE circumduction. W/ knee brace locked @ 60 degrees.    TODAY'S TREATMENT:                                                                                                                              DATE:  04/09/2023  -Recumbent bike x 7'; seat 12; full revolutions.  -Modified wall sit w/ 2in under RLE; 10 x 7' hold. Visual cues with mirror.  -2in stepups x 20; cues for proper movement patterns and no UE support -Forward eccentric 2in step downs w/ LLE x 5 BUE support.  -Heel raises; 2up;1down w/ LLE x 20 -2in box for 1 x 20 LLE Knee flexionw/ 2lb ankle weight.  -1 x 10 with 4lb ankle weight  04/06/2023  -Recumbent bike x 7'; seat 13; Full rotation. -45 degree wall sits w/ mirror for visual cues 10x 7-10' -2x 15 RTB standing hip adduction  -2x 15 RTB standing hip abduction -Heel raises; bilateral concentric, unilateral eccentric with LLE x 20.  -2in stepups w/o BUE support. x20 -2in stepdowns w/ single UE support. X20 -bodycraft TKE #1 plate x 12  10/31/1094  -Recumbent Bike x7; achieving full rotations. Seat 14 -2in stepups with RUE support x 10;  -Blue TB TKE x 30 -Mini wall squats 15 x 5' hold  -Standing knee flexion w/ 2lb ankle weight 2 x 12 repetitions.  -Standing hip abduction 1 x 20 repetitions.  -60ft gait training. Cues for continue L weight shift and knee flexion during swing.     PATIENT EDUCATION:  Education details:03/12/2023 Using towel underneath for quad set.  Education method: Medical illustrator Education comprehension: verbalized understanding and returned demonstration  HOME EXERCISE PROGRAM: Access Code:  [email or web address] URL: [email or web address]  03/08/23- Standing Heel Raises  - 3 x daily - 7 x weekly - 2 sets - 20 reps -  Toe Raises with Counter Support  - 3 x daily - 7 x weekly - 2 sets - 20 reps - Mini Squat with Counter Support  - 3 x daily - 7 x weekly - 3 sets - 10 reps  Date: 03/05/2023 - Sidelying Hip Abduction  - 1 x daily - 7 x weekly - 3 sets - 10 reps - Sidelying Hip Adduction  - 1 x daily - 7 x weekly - 3 sets - 10 reps - Prone Hip Extension  - 1 x daily - 7 x weekly - 3 sets - 10 reps - Supine Quadricep Sets  - 1 x daily - 7 x weekly - 3 sets - 10 reps - Supine Heel Slide  - 1 x daily - 7 x weekly - 3 sets - 10 reps - Long Sitting Ankle Plantar Flexion with Resistance  - 1 x daily - 7 x weekly - 3 sets - 10 reps - Long Sitting Ankle Dorsiflexion with Anchored Resistance  - 1 x daily - 7 x weekly - 3 sets - 10 reps - Long Sitting Ankle Inversion with Resistance  - 1 x daily - 7 x weekly - 3 sets - 10 reps - Long Sitting Ankle Eversion with Resistance  - 1 x daily - 7 x weekly - 3 sets - 10 reps  ASSESSMENT:  CLINICAL IMPRESSION: Pt tolerating session well with further progress in ROM. Actively achieving 110 on recumbent bike. Improved control and showing increased hypertrophy in L quadriceps. Pt will continue benefit from skilled physical therapy services to return to prior PLOF and functional status.   OBJECTIVE IMPAIRMENTS: Abnormal gait, decreased mobility, decreased ROM, decreased strength, hypomobility, impaired flexibility, and pain.   ACTIVITY LIMITATIONS: carrying, lifting, sitting, standing, squatting, sleeping, stairs, transfers, and locomotion level  PARTICIPATION LIMITATIONS: community activity, occupation, and yard work  PERSONAL FACTORS: Age are also affecting patient's functional outcome.   REHAB POTENTIAL: Excellent  CLINICAL DECISION MAKING: Stable/uncomplicated  EVALUATION COMPLEXITY: Low   GOALS: Goals reviewed with patient? No  SHORT  TERM GOALS: Target date: 03/26/2023  Pt and caregivers will be independent with HEP in order to demonstrate participation in Physical Therapy POC.  Baseline: Goal status: INITIAL  2.  Pt will report 10% increase in subjective functional performance in order to demonstrate improved functional capacity.  Baseline: Pt reports 40% Goal status: INITIAL  LONG TERM GOALS: Target date: 04/16/2023  Pt will ambulate with nonantalgic symmetrical normal gait biomechanics for >500 feet in order to demonstrate increase in functional mobility. Baseline: See objective Goal status: INITIAL  2.  Pt will increase LLE MMT to +4/5 or equal to RLE MMT in order to demonstrate increased muscular strength gain. Baseline: See objective Goal status: INITIAL  3.  Pt will improve L knee range of motion to 120 degrees or equal to R knee range of motion to improve upright functional mobility. Baseline: See objective Goal status: INITIAL  4.  Pt will improve LFES score by >50% of original score in order to demonstrate improved functional use of LLE. Baseline: see objective Goal status: INITIAL  PLAN:  PT FREQUENCY: 2x/week  PT DURATION: 6 weeks  PLANNED INTERVENTIONS: Therapeutic exercises, Therapeutic activity, Neuromuscular re-education, Balance training, Gait training, Patient/Family education, Self Care, Joint mobilization, Joint manipulation, Stair training, Dry Needling, Electrical stimulation, Cryotherapy, scar mobilization, Manual therapy, and Re-evaluation  PLAN FOR NEXT SESSION: L knee range of motion, [place] e-stim for L quad activation, patellar mobilizations,  Starting from 6-12 Week [employer]  Tendon protocol*  Becky Sax, LPTA/CLT; CBIS [phone]  Nelida Meuse, PT 04/09/2023, 3:16 PM

## 2023-04-13 ENCOUNTER — Encounter (HOSPITAL_COMMUNITY): Payer: Medicaid Other

## 2023-04-13 ENCOUNTER — Encounter (HOSPITAL_COMMUNITY): Payer: Self-pay

## 2023-04-13 NOTE — Therapy (Signed)
[facility] at [facility] [address] [place], [place], [address] Phone: [phone]   Fax:  [phone]  Patient Details  Name: Frederick Stewart MRN: [id number] Date of Birth: 09-11-2001 Referring Provider:  No ref. provider found  Encounter Date: 04/13/2023  Left voicemail regarding 1st no show for visit today at 3:15pm. Informed pt to call back to get rescheduled.   Nelida Meuse, PT 04/13/2023, 3:34 PM  [facility] at [facility] [address] [place], [place], [address] Phone: [phone]   Fax:  [phone]

## 2023-04-30 ENCOUNTER — Ambulatory Visit (HOSPITAL_COMMUNITY): Payer: Medicaid Other | Attending: Orthopedic Surgery

## 2023-04-30 DIAGNOSIS — S82035D Nondisplaced transverse fracture of left patella, subsequent encounter for closed fracture with routine healing: Secondary | ICD-10-CM

## 2023-04-30 DIAGNOSIS — M25662 Stiffness of left knee, not elsewhere classified: Secondary | ICD-10-CM | POA: Diagnosis not present

## 2023-04-30 DIAGNOSIS — M6281 Muscle weakness (generalized): Secondary | ICD-10-CM

## 2023-04-30 NOTE — Therapy (Signed)
OUTPATIENT PHYSICAL THERAPY TREATMENT   Patient Name: Frederick Stewart MRN: [id number] DOB:2000/12/07, 22 y.o., male Today's Date: 04/30/2023  END OF SESSION: END OF SESSION:   PT End of Session - 04/30/23 1348     Visit Number 10    Number of Visits 12    Date for PT Re-Evaluation 04/16/23    Authorization Type medicaid [employer]    Authorization Time Period approved 12 visits from 03/02/23-06/02/2023    Authorization - Visit Number 9    Authorization - Number of Visits 12    Progress Note Due on Visit 10    PT Start Time 0146    PT Stop Time 0230    PT Time Calculation (min) 44 min    Activity Tolerance Patient tolerated treatment well    Behavior During Therapy [facility] for tasks assessed/performed                No past medical history on file. Past Surgical History:  Procedure Laterality Date   ORIF PATELLA Left 01/27/2023   Procedure: EXCISION OF PATELLA FRAGMENT, PATELLA TENDON ADVANCEMENT;  Surgeon: Vickki Hearing, MD;  Location: [facility];  Service: Orthopedics;  Laterality: Left;   Patient Active Problem List   Diagnosis Date Noted   Closed nondisplaced transverse fracture of patella with routine healing 01/29/2023   Patellar tendon rupture, left, subsequent encounter repair 01/27/23 01/29/2023   Eczema 05/29/2015   Sports physical 08/21/2013   Prolonged Q-T interval on ECG 08/21/2013   Bradycardia 08/21/2013   Well child check 08/18/2013   Need for prophylactic vaccination and inoculation against influenza 08/18/2013   BMI (body mass index), pediatric, 5% to less than 85% for age 22/24/2014   Acne 08/18/2013    PCP: NA  REFERRING PROVIDER: Vickki Hearing MD  REFERRING DIAG:  S82.035D (ICD-10-CM) - Closed nondisplaced transverse fracture of left patella with routine healing, subsequent encounter  S86.812D (ICD-10-CM) - Patellar tendon rupture, left, subsequent encounter    THERAPY DIAG:  Closed nondisplaced transverse fracture of left patella with  routine healing  Muscle weakness (generalized)  Decreased range of motion (ROM) of left knee  Rationale for Evaluation and Treatment: Rehabilitation  ONSET DATE: 01/15/2023  SUBJECTIVE:   SUBJECTIVE STATEMENT: Feeling sore today; "probably due to having to drive a little more"; starting to do the steps step over step; am doing well just need to build my muscle up.    PERTINENT HISTORY: ORIF Patella fracture Patellar Tendon Tear PAIN:  Are you having pain? No "sometimes some popping"  PRECAUTIONS: Other: Locked brace @ 60 degrees. Brace and PT  advancement as tolerated.   WEIGHT BEARING RESTRICTIONS:  WBAT  FALLS:  Has patient fallen in last 6 months? No  LIVING ENVIRONMENT: Lives with: lives with their spouse Lives in: House/apartment Stairs:  No stairs currently since living at [facility].  Has following equipment at home: None  OCCUPATION: "nothing right now"  PLOF: Independent  PATIENT GOALS: "walk normal"  NEXT MD VISIT: 04/01/2023; 05/06/23  OBJECTIVE:   DIAGNOSTIC FINDINGS:   PATIENT SURVEYS:  LEFS 40/80  COGNITION: Overall cognitive status: Within functional limits for tasks assessed     SENSATION: WFL  POSTURE: No Significant postural limitations  PALPATION: Mild TTP along lateral L knee midline lateral to incision  LOWER EXTREMITY ROM:  Active ROM Right eval Left eval Left  03/08/23 Left 03/23/23 Left 04/09/2023  Hip flexion       Hip extension       Hip  abduction       Hip adduction       Hip internal rotation       Hip external rotation       Knee flexion 135 55 50 AAROM 70 AROM 95 110  Knee extension 0 0 2 hyperextension  2 hyperextension  Ankle dorsiflexion       Ankle plantarflexion       Ankle inversion       Ankle eversion        (Blank rows = not tested)  LOWER EXTREMITY MMT:  MMT Right eval Left eval  Hip flexion 4- 2+(pain & L knee)  Hip extension    Hip abduction 4-   Hip adduction    Hip internal rotation     Hip external rotation    Knee flexion    Knee extension 5 1  Ankle dorsiflexion    Ankle plantarflexion    Ankle inversion    Ankle eversion     (Blank rows = not tested)   FUNCTIONAL TESTS:  2 minute walk test: 265 ft  GAIT: Distance walked: 227ft Assistive device utilized: None Level of assistance: Complete Independence Comments: Antalgic gait pattern with LLE circumduction. W/ knee brace locked @ 60 degrees.    TODAY'S TREATMENT:                                                                                                                              DATE:  04/30/2023 Recumbent bike x 7'; seat 12; full revolutions dynamic warm up  Body craft Single leg press 2 plates 2 x 10 Cybex hamstring curls 2.5 plates 2 x 10 Single leg deadlift 10#  2 x 10 Lateral step over cones x 5 with thick blue band      04/09/2023  -Recumbent bike x 7'; seat 12; full revolutions.  -Modified wall sit w/ 2in under RLE; 10 x 7' hold. Visual cues with mirror.  -2in stepups x 20; cues for proper movement patterns and no UE support -Forward eccentric 2in step downs w/ LLE x 5 BUE support.  -Heel raises; 2up;1down w/ LLE x 20 -2in box for 1 x 20 LLE Knee flexionw/ 2lb ankle weight.  -1 x 10 with 4lb ankle weight  04/06/2023  -Recumbent bike x 7'; seat 13; Full rotation. -45 degree wall sits w/ mirror for visual cues 10x 7-10' -2x 15 RTB standing hip adduction  -2x 15 RTB standing hip abduction -Heel raises; bilateral concentric, unilateral eccentric with LLE x 20.  -2in stepups w/o BUE support. x20 -2in stepdowns w/ single UE support. X20 -bodycraft TKE #1 plate x 12  10/31/1094  -Recumbent Bike x7; achieving full rotations. Seat 14 -2in stepups with RUE support x 10;  -Blue TB TKE x 30 -Mini wall squats 15 x 5' hold  -Standing knee flexion w/ 2lb ankle weight 2 x 12 repetitions.  -Standing hip abduction 1 x 20 repetitions.  -56ft gait training. Cues  for continue L weight shift and knee  flexion during swing.     PATIENT EDUCATION:  Education details:03/12/2023 Using towel underneath for quad set.  Education method: Medical illustrator Education comprehension: verbalized understanding and returned demonstration  HOME EXERCISE PROGRAM: Access Code: [email or web address] URL: [email or web address]  03/08/23- Standing Heel Raises  - 3 x daily - 7 x weekly - 2 sets - 20 reps - Toe Raises with Counter Support  - 3 x daily - 7 x weekly - 2 sets - 20 reps - Mini Squat with Counter Support  - 3 x daily - 7 x weekly - 3 sets - 10 reps  Date: 03/05/2023 - Sidelying Hip Abduction  - 1 x daily - 7 x weekly - 3 sets - 10 reps - Sidelying Hip Adduction  - 1 x daily - 7 x weekly - 3 sets - 10 reps - Prone Hip Extension  - 1 x daily - 7 x weekly - 3 sets - 10 reps - Supine Quadricep Sets  - 1 x daily - 7 x weekly - 3 sets - 10 reps - Supine Heel Slide  - 1 x daily - 7 x weekly - 3 sets - 10 reps - Long Sitting Ankle Plantar Flexion with Resistance  - 1 x daily - 7 x weekly - 3 sets - 10 reps - Long Sitting Ankle Dorsiflexion with Anchored Resistance  - 1 x daily - 7 x weekly - 3 sets - 10 reps - Long Sitting Ankle Inversion with Resistance  - 1 x daily - 7 x weekly - 3 sets - 10 reps - Long Sitting Ankle Eversion with Resistance  - 1 x daily - 7 x weekly - 3 sets - 10 reps  ASSESSMENT:  CLINICAL IMPRESSION: Today's session worked on strength of left leg with single leg exercises, PRE's.  Has some difficulty maintaining balance with sidestepping activity with resistance; noted continued quad weakness.   Pt will continue benefit from skilled physical therapy services to return to prior PLOF and functional status.   OBJECTIVE IMPAIRMENTS: Abnormal gait, decreased mobility, decreased ROM, decreased strength, hypomobility, impaired flexibility, and pain.   ACTIVITY LIMITATIONS: carrying, lifting, sitting, standing, squatting, sleeping, stairs, transfers, and locomotion  level  PARTICIPATION LIMITATIONS: community activity, occupation, and yard work  PERSONAL FACTORS: Age are also affecting patient's functional outcome.   REHAB POTENTIAL: Excellent  CLINICAL DECISION MAKING: Stable/uncomplicated  EVALUATION COMPLEXITY: Low   GOALS: Goals reviewed with patient? No  SHORT TERM GOALS: Target date: 03/26/2023  Pt and caregivers will be independent with HEP in order to demonstrate participation in Physical Therapy POC.  Baseline: Goal status: INITIAL  2.  Pt will report 10% increase in subjective functional performance in order to demonstrate improved functional capacity.  Baseline: Pt reports 40% Goal status: INITIAL  LONG TERM GOALS: Target date: 04/16/2023  Pt will ambulate with nonantalgic symmetrical normal gait biomechanics for >500 feet in order to demonstrate increase in functional mobility. Baseline: See objective Goal status: INITIAL  2.  Pt will increase LLE MMT to +4/5 or equal to RLE MMT in order to demonstrate increased muscular strength gain. Baseline: See objective Goal status: INITIAL  3.  Pt will improve L knee range of motion to 120 degrees or equal to R knee range of motion to improve upright functional mobility. Baseline: See objective Goal status: INITIAL  4.  Pt will improve LFES score by >50% of original score in order to demonstrate improved functional use of LLE. Baseline:  see objective Goal status: INITIAL  PLAN:  PT FREQUENCY: 2x/week  PT DURATION: 6 weeks  PLANNED INTERVENTIONS: Therapeutic exercises, Therapeutic activity, Neuromuscular re-education, Balance training, Gait training, Patient/Family education, Self Care, Joint mobilization, Joint manipulation, Stair training, Dry Needling, Electrical stimulation, Cryotherapy, scar mobilization, Manual therapy, and Re-evaluation  PLAN FOR NEXT SESSION: L knee range of motion, [place] e-stim for L quad activation, patellar mobilizations,  Starting from 6-12 Week  [place] Patellar Tendon protocol* sees MD 7/11  2:26 PM, 04/30/23 Deaun Rocha Small Aleece Loyd MPT  physical therapy [place] [phone] [phone]

## 2023-05-03 ENCOUNTER — Ambulatory Visit (HOSPITAL_COMMUNITY): Payer: Medicaid Other

## 2023-05-03 DIAGNOSIS — M6281 Muscle weakness (generalized): Secondary | ICD-10-CM

## 2023-05-03 DIAGNOSIS — S82035D Nondisplaced transverse fracture of left patella, subsequent encounter for closed fracture with routine healing: Secondary | ICD-10-CM

## 2023-05-03 DIAGNOSIS — M25662 Stiffness of left knee, not elsewhere classified: Secondary | ICD-10-CM

## 2023-05-03 NOTE — Therapy (Signed)
OUTPATIENT PHYSICAL THERAPY TREATMENT/ PROGRESS NOTE Progress Note Reporting Period 03/05/23  to 05/03/2023  See note below for Objective Data and Assessment of Progress/Goals.       Patient Name: Frederick Stewart MRN: [id number] DOB:03/14/2001, 22 y.o., male Today's Date: 05/03/2023  END OF SESSION: END OF SESSION:   PT End of Session - 05/03/23 1443     Visit Number 11    Number of Visits 12    Date for PT Re-Evaluation 06/03/23    Authorization Type medicaid [employer]    Authorization Time Period approved 12 visits from 03/02/23-06/02/2023    Authorization - Visit Number 10    Authorization - Number of Visits 12    Progress Note Due on Visit 10    PT Start Time 0237    PT Stop Time 0315    PT Time Calculation (min) 38 min    Activity Tolerance Patient tolerated treatment well    Behavior During Therapy [facility] for tasks assessed/performed                No past medical history on file. Past Surgical History:  Procedure Laterality Date   ORIF PATELLA Left 01/27/2023   Procedure: EXCISION OF PATELLA FRAGMENT, PATELLA TENDON ADVANCEMENT;  Surgeon: Vickki Hearing, MD;  Location: [facility];  Service: Orthopedics;  Laterality: Left;   Patient Active Problem List   Diagnosis Date Noted   Closed nondisplaced transverse fracture of patella with routine healing 01/29/2023   Patellar tendon rupture, left, subsequent encounter repair 01/27/23 01/29/2023   Eczema 05/29/2015   Sports physical 08/21/2013   Prolonged Q-T interval on ECG 08/21/2013   Bradycardia 08/21/2013   Well child check 08/18/2013   Need for prophylactic vaccination and inoculation against influenza 08/18/2013   BMI (body mass index), pediatric, 5% to less than 85% for age 59/24/2014   Acne 08/18/2013    PCP: NA  REFERRING PROVIDER: Vickki Hearing MD  REFERRING DIAG:  S82.035D (ICD-10-CM) - Closed nondisplaced transverse fracture of left patella with routine healing, subsequent encounter  S86.812D  (ICD-10-CM) - Patellar tendon rupture, left, subsequent encounter    THERAPY DIAG:  Closed nondisplaced transverse fracture of left patella with routine healing - Plan: PT plan of care cert/re-cert  Muscle weakness (generalized) - Plan: PT plan of care cert/re-cert  Decreased range of motion (ROM) of left knee - Plan: PT plan of care cert/re-cert  Rationale for Evaluation and Treatment: Rehabilitation  ONSET DATE: 01/15/2023  SUBJECTIVE:   SUBJECTIVE STATEMENT: About "65%" better overall; has not resumed sporting activities or running.  PERTINENT HISTORY: ORIF Patella fracture Patellar Tendon Tear PAIN:  Are you having pain? No "sometimes some popping"  PRECAUTIONS: Other: Locked brace @ 60 degrees. Brace and PT  advancement as tolerated.   WEIGHT BEARING RESTRICTIONS:  WBAT  FALLS:  Has patient fallen in last 6 months? No  LIVING ENVIRONMENT: Lives with: lives with their spouse Lives in: House/apartment Stairs:  No stairs currently since living at [facility].  Has following equipment at home: None  OCCUPATION: "nothing right now"  PLOF: Independent  PATIENT GOALS: "walk normal"  NEXT MD VISIT: 04/01/2023; 05/06/23  OBJECTIVE:   DIAGNOSTIC FINDINGS:   PATIENT SURVEYS:  LEFS 40/80  COGNITION: Overall cognitive status: Within functional limits for tasks assessed     SENSATION: WFL  POSTURE: No Significant postural limitations  PALPATION: Mild TTP along lateral L knee midline lateral to incision  LOWER EXTREMITY ROM:  Active ROM Right eval Left eval  Left  03/08/23 Left 03/23/23 Left 04/09/2023 Left 05/03/23  Hip flexion        Hip extension        Hip abduction        Hip adduction        Hip internal rotation        Hip external rotation        Knee flexion 135 55 50 AAROM 70 AROM 95 110 126  Knee extension 0 0 2 hyperextension  2 hyperextension Equal to right  Ankle dorsiflexion        Ankle plantarflexion        Ankle inversion        Ankle  eversion         (Blank rows = not tested)  LOWER EXTREMITY MMT:  MMT Right eval Left eval Left 05/03/23  Hip flexion 4- 2+(pain & L knee) 4  Hip extension     Hip abduction 4-    Hip adduction     Hip internal rotation     Hip external rotation     Knee flexion   4 (sitting)  Knee extension 5 1 4-  Ankle dorsiflexion     Ankle plantarflexion     Ankle inversion     Ankle eversion      (Blank rows = not tested)   FUNCTIONAL TESTS:  2 minute walk test: 265 ft  GAIT: Distance walked: 266ft Assistive device utilized: None Level of assistance: Complete Independence Comments: Antalgic gait pattern with LLE circumduction. W/ knee brace locked @ 60 degrees.    TODAY'S TREATMENT:                                                                                                                              DATE:  05/03/23 Recumbent bike x 5'; seat 12; full revolutions dynamic warm up   SLR without lag 2 x 10 AROM and MMT's see above LEFS 53/80 66.3% 2 MWT 414 ft Girth 16 cm about superior pole of patella  right quad 48 cm  left quad 42.75 cm  Leg press bodycraft 3 plates 2 x 10      04/30/2023 Recumbent bike x 7'; seat 12; full revolutions dynamic warm up  Body craft Single leg press 2 plates 2 x 10 Cybex hamstring curls 2.5 plates 2 x 10 Single leg deadlift 10#  2 x 10 Lateral step over cones x 5 with thick blue band      04/09/2023  -Recumbent bike x 7'; seat 12; full revolutions.  -Modified wall sit w/ 2in under RLE; 10 x 7' hold. Visual cues with mirror.  -2in stepups x 20; cues for proper movement patterns and no UE support -Forward eccentric 2in step downs w/ LLE x 5 BUE support.  -Heel raises; 2up;1down w/ LLE x 20 -2in box for 1 x 20 LLE Knee flexionw/ 2lb ankle weight.  -1 x 10 with 4lb ankle weight  04/06/2023  -Recumbent bike x 7'; seat 13; Full rotation. -45 degree wall sits w/ mirror for visual cues 10x 7-10' -2x 15 RTB standing hip adduction  -2x  15 RTB standing hip abduction -Heel raises; bilateral concentric, unilateral eccentric with LLE x 20.  -2in stepups w/o BUE support. x20 -2in stepdowns w/ single UE support. X20 -bodycraft TKE #1 plate x 12  07/02/2951  -Recumbent Bike x7; achieving full rotations. Seat 14 -2in stepups with RUE support x 10;  -Blue TB TKE x 30 -Mini wall squats 15 x 5' hold  -Standing knee flexion w/ 2lb ankle weight 2 x 12 repetitions.  -Standing hip abduction 1 x 20 repetitions.  -27ft gait training. Cues for continue L weight shift and knee flexion during swing.     PATIENT EDUCATION:  Education details:03/12/2023 Using towel underneath for quad set.  Education method: Medical illustrator Education comprehension: verbalized understanding and returned demonstration  HOME EXERCISE PROGRAM: Access Code: [email or web address] URL: [email or web address]  03/08/23- Standing Heel Raises  - 3 x daily - 7 x weekly - 2 sets - 20 reps - Toe Raises with Counter Support  - 3 x daily - 7 x weekly - 2 sets - 20 reps - Mini Squat with Counter Support  - 3 x daily - 7 x weekly - 3 sets - 10 reps  Date: 03/05/2023 - Sidelying Hip Abduction  - 1 x daily - 7 x weekly - 3 sets - 10 reps - Sidelying Hip Adduction  - 1 x daily - 7 x weekly - 3 sets - 10 reps - Prone Hip Extension  - 1 x daily - 7 x weekly - 3 sets - 10 reps - Supine Quadricep Sets  - 1 x daily - 7 x weekly - 3 sets - 10 reps - Supine Heel Slide  - 1 x daily - 7 x weekly - 3 sets - 10 reps - Long Sitting Ankle Plantar Flexion with Resistance  - 1 x daily - 7 x weekly - 3 sets - 10 reps - Long Sitting Ankle Dorsiflexion with Anchored Resistance  - 1 x daily - 7 x weekly - 3 sets - 10 reps - Long Sitting Ankle Inversion with Resistance  - 1 x daily - 7 x weekly - 3 sets - 10 reps - Long Sitting Ankle Eversion with Resistance  - 1 x daily - 7 x weekly - 3 sets - 10 reps  ASSESSMENT:  CLINICAL IMPRESSION: Progress note today.  Patient with  decreased quad girth left versus right but overall improving strength and mobility.   Improved 2 MWT and LEFS score.  Patient has met 2/2 STG's and 2/4 LTG's.   Pt will continue benefit from skilled physical therapy services to return to prior PLOF and functional status and to meet unmet and partially met goals.  OBJECTIVE IMPAIRMENTS: Abnormal gait, decreased mobility, decreased ROM, decreased strength, hypomobility, impaired flexibility, and pain.   ACTIVITY LIMITATIONS: carrying, lifting, sitting, standing, squatting, sleeping, stairs, transfers, and locomotion level  PARTICIPATION LIMITATIONS: community activity, occupation, and yard work  PERSONAL FACTORS: Age are also affecting patient's functional outcome.   REHAB POTENTIAL: Excellent  CLINICAL DECISION MAKING: Stable/uncomplicated  EVALUATION COMPLEXITY: Low   GOALS: Goals reviewed with patient? No  SHORT TERM GOALS: Target date: 03/26/2023  Pt and caregivers will be independent with HEP in order to demonstrate participation in Physical Therapy POC.  Baseline: Goal status: MET  2.  Pt will report 10% increase in subjective functional performance  in order to demonstrate improved functional capacity.  Baseline: Pt reports 40% Goal status: MET  LONG TERM GOALS: Target date: 04/16/2023  Pt will ambulate with nonantalgic symmetrical normal gait biomechanics for >500 feet in order to demonstrate increase in functional mobility. Baseline: See objective Goal status: INITIAL  2.  Pt will increase LLE MMT to +4/5 or equal to RLE MMT in order to demonstrate increased muscular strength gain. Baseline: See objective Goal status: INITIAL  3.  Pt will improve L knee range of motion to 120 degrees or equal to R knee range of motion to improve upright functional mobility. Baseline: See objective Goal status: MET  4.  Pt will improve LFES score by >50% of original score in order to demonstrate improved functional use of LLE. Baseline:  see objective Goal status: MET  PLAN:  PT FREQUENCY: 2x/week  PT DURATION: 6 weeks  PLANNED INTERVENTIONS: Therapeutic exercises, Therapeutic activity, Neuromuscular re-education, Balance training, Gait training, Patient/Family education, Self Care, Joint mobilization, Joint manipulation, Stair training, Dry Needling, Electrical stimulation, Cryotherapy, scar mobilization, Manual therapy, and Re-evaluation  PLAN FOR NEXT SESSION: L quad strengthening, sees MD 7/11  3:15 PM, 05/03/23 Kendrell Lottman Small Chai Verdejo MPT [facility] [place] [phone] Ph:[phone]

## 2023-05-06 ENCOUNTER — Ambulatory Visit: Payer: Medicaid Other | Admitting: Orthopedic Surgery

## 2023-05-07 ENCOUNTER — Ambulatory Visit (HOSPITAL_COMMUNITY): Payer: Medicaid Other

## 2023-05-14 ENCOUNTER — Encounter (HOSPITAL_COMMUNITY): Payer: Medicaid Other

## 2023-05-18 ENCOUNTER — Encounter (HOSPITAL_COMMUNITY): Payer: Medicaid Other

## 2023-05-21 ENCOUNTER — Ambulatory Visit (HOSPITAL_COMMUNITY): Payer: Medicaid Other

## 2023-05-21 DIAGNOSIS — M25662 Stiffness of left knee, not elsewhere classified: Secondary | ICD-10-CM

## 2023-05-21 DIAGNOSIS — M6281 Muscle weakness (generalized): Secondary | ICD-10-CM | POA: Diagnosis not present

## 2023-05-21 DIAGNOSIS — S82035D Nondisplaced transverse fracture of left patella, subsequent encounter for closed fracture with routine healing: Secondary | ICD-10-CM | POA: Diagnosis not present

## 2023-05-21 NOTE — Therapy (Signed)
OUTPATIENT PHYSICAL THERAPY TREATMENT     Patient Name: Frederick Stewart MRN: [id number] DOB:02-12-2001, 22 y.o., male Today's Date: 05/21/2023  END OF SESSION: END OF SESSION:   PT End of Session - 05/21/23 1348     Visit Number 12    Number of Visits 20    Date for PT Re-Evaluation 06/02/23    Authorization Type medicaid [employer]    Authorization Time Period approved 12 visits from 03/02/23-06/02/2023    Authorization - Visit Number 11    Authorization - Number of Visits 12    Progress Note Due on Visit 10    PT Start Time 1350    PT Stop Time 1430    PT Time Calculation (min) 40 min    Activity Tolerance Patient tolerated treatment well    Behavior During Therapy [facility] for tasks assessed/performed                No past medical history on file. Past Surgical History:  Procedure Laterality Date   ORIF PATELLA Left 01/27/2023   Procedure: EXCISION OF PATELLA FRAGMENT, PATELLA TENDON ADVANCEMENT;  Surgeon: Vickki Hearing, MD;  Location: [facility];  Service: Orthopedics;  Laterality: Left;   Patient Active Problem List   Diagnosis Date Noted   Closed nondisplaced transverse fracture of patella with routine healing 01/29/2023   Patellar tendon rupture, left, subsequent encounter repair 01/27/23 01/29/2023   Eczema 05/29/2015   Sports physical 08/21/2013   Prolonged Q-T interval on ECG 08/21/2013   Bradycardia 08/21/2013   Well child check 08/18/2013   Need for prophylactic vaccination and inoculation against influenza 08/18/2013   BMI (body mass index), pediatric, 5% to less than 85% for age 84/24/2014   Acne 08/18/2013    PCP: NA  REFERRING PROVIDER: Vickki Hearing MD  REFERRING DIAG:  S82.035D (ICD-10-CM) - Closed nondisplaced transverse fracture of left patella with routine healing, subsequent encounter  S86.812D (ICD-10-CM) - Patellar tendon rupture, left, subsequent encounter    THERAPY DIAG:  Closed nondisplaced transverse fracture of left patella  with routine healing  Muscle weakness (generalized)  Decreased range of motion (ROM) of left knee  Rationale for Evaluation and Treatment: Rehabilitation  ONSET DATE: 01/15/2023  SUBJECTIVE:   SUBJECTIVE STATEMENT: A little pain before the rain but otherwise doing fine. Has had to miss therapy due to recent passing of his mother.    PERTINENT HISTORY: ORIF Patella fracture Patellar Tendon Tear PAIN:  Are you having pain? No "sometimes some popping"  PRECAUTIONS: Other: Locked brace @ 60 degrees. Brace and PT  advancement as tolerated.   WEIGHT BEARING RESTRICTIONS:  WBAT  FALLS:  Has patient fallen in last 6 months? No  LIVING ENVIRONMENT: Lives with: lives with their spouse Lives in: House/apartment Stairs:  No stairs currently since living at [facility].  Has following equipment at home: None  OCCUPATION: "nothing right now"  PLOF: Independent  PATIENT GOALS: "walk normal"  NEXT MD VISIT: 04/01/2023; 05/06/23  OBJECTIVE:   DIAGNOSTIC FINDINGS:   PATIENT SURVEYS:  LEFS 40/80  COGNITION: Overall cognitive status: Within functional limits for tasks assessed     SENSATION: WFL  POSTURE: No Significant postural limitations  PALPATION: Mild TTP along lateral L knee midline lateral to incision  LOWER EXTREMITY ROM:  Active ROM Right eval Left eval Left  03/08/23 Left 03/23/23 Left 04/09/2023 Left 05/03/23  Hip flexion        Hip extension        Hip abduction  Hip adduction        Hip internal rotation        Hip external rotation        Knee flexion 135 55 50 AAROM 70 AROM 95 110 126  Knee extension 0 0 2 hyperextension  2 hyperextension Equal to right  Ankle dorsiflexion        Ankle plantarflexion        Ankle inversion        Ankle eversion         (Blank rows = not tested)  LOWER EXTREMITY MMT:  MMT Right eval Left eval Left 05/03/23  Hip flexion 4- 2+(pain & L knee) 4  Hip extension     Hip abduction 4-    Hip adduction     Hip  internal rotation     Hip external rotation     Knee flexion   4 (sitting)  Knee extension 5 1 4-  Ankle dorsiflexion     Ankle plantarflexion     Ankle inversion     Ankle eversion      (Blank rows = not tested)   FUNCTIONAL TESTS:  2 minute walk test: 265 ft  GAIT: Distance walked: 240ft Assistive device utilized: None Level of assistance: Complete Independence Comments: Antalgic gait pattern with LLE circumduction. W/ knee brace locked @ 60 degrees.    TODAY'S TREATMENT:                                                                                                                              DATE:  05/21/23 Recumbent bike x 5'; seat 12; full revolutions dynamic warm up   Sit to stand 10# weight with right foot extended out 2 x 10 Split squat 10#  2 x 10 Calf raises x 10 both, x 10 left leg Calf stretch on step 3 x 20" Left leg 3 plates leg press 3 x 10 Quad stretch in standing 5 x 20"  05/03/23 Recumbent bike x 5'; seat 12; full revolutions dynamic warm up   SLR without lag 2 x 10 AROM and MMT's see above LEFS 53/80 66.3% 2 MWT 414 ft Girth 16 cm about superior pole of patella  right quad 48 cm  left quad 42.75 cm  Leg press bodycraft 3 plates 2 x 10      04/30/2023 Recumbent bike x 7'; seat 12; full revolutions dynamic warm up  Body craft Single leg press 2 plates 2 x 10 Cybex hamstring curls 2.5 plates 2 x 10 Single leg deadlift 10#  2 x 10 Lateral step over cones x 5 with thick blue band      04/09/2023  -Recumbent bike x 7'; seat 12; full revolutions.  -Modified wall sit w/ 2in under RLE; 10 x 7' hold. Visual cues with mirror.  -2in stepups x 20; cues for proper movement patterns and no UE support -Forward eccentric 2in step downs w/ LLE x 5 BUE  support.  -Heel raises; 2up;1down w/ LLE x 20 -2in box for 1 x 20 LLE Knee flexionw/ 2lb ankle weight.  -1 x 10 with 4lb ankle weight  04/06/2023  -Recumbent bike x 7'; seat 13; Full rotation. -45 degree  wall sits w/ mirror for visual cues 10x 7-10' -2x 15 RTB standing hip adduction  -2x 15 RTB standing hip abduction -Heel raises; bilateral concentric, unilateral eccentric with LLE x 20.  -2in stepups w/o BUE support. x20 -2in stepdowns w/ single UE support. X20 -bodycraft TKE #1 plate x 12  10/31/1094  -Recumbent Bike x7; achieving full rotations. Seat 14 -2in stepups with RUE support x 10;  -Blue TB TKE x 30 -Mini wall squats 15 x 5' hold  -Standing knee flexion w/ 2lb ankle weight 2 x 12 repetitions.  -Standing hip abduction 1 x 20 repetitions.  -99ft gait training. Cues for continue L weight shift and knee flexion during swing.     PATIENT EDUCATION:  Education details:03/12/2023 Using towel underneath for quad set.  Education method: Medical illustrator Education comprehension: verbalized understanding and returned demonstration  HOME EXERCISE PROGRAM: Access Code: [email or web address] URL: [email or web address]  03/08/23- Standing Heel Raises  - 3 x daily - 7 x weekly - 2 sets - 20 reps - Toe Raises with Counter Support  - 3 x daily - 7 x weekly - 2 sets - 20 reps - Mini Squat with Counter Support  - 3 x daily - 7 x weekly - 3 sets - 10 reps  Date: 03/05/2023 - Sidelying Hip Abduction  - 1 x daily - 7 x weekly - 3 sets - 10 reps - Sidelying Hip Adduction  - 1 x daily - 7 x weekly - 3 sets - 10 reps - Prone Hip Extension  - 1 x daily - 7 x weekly - 3 sets - 10 reps - Supine Quadricep Sets  - 1 x daily - 7 x weekly - 3 sets - 10 reps - Supine Heel Slide  - 1 x daily - 7 x weekly - 3 sets - 10 reps - Long Sitting Ankle Plantar Flexion with Resistance  - 1 x daily - 7 x weekly - 3 sets - 10 reps - Long Sitting Ankle Dorsiflexion with Anchored Resistance  - 1 x daily - 7 x weekly - 3 sets - 10 reps - Long Sitting Ankle Inversion with Resistance  - 1 x daily - 7 x weekly - 3 sets - 10 reps - Long Sitting Ankle Eversion with Resistance  - 1 x daily - 7 x weekly - 3 sets  - 10 reps  ASSESSMENT:  CLINICAL IMPRESSION: Today's session continued to focus on lower extremity strengthening. Added split squats and front squats to treatment with good challenge; noted max quad fatigue at the end of treatment today.  Patient will benefit from continued skilled therapy services to address deficits and promote return to optimal function.      OBJECTIVE IMPAIRMENTS: Abnormal gait, decreased mobility, decreased ROM, decreased strength, hypomobility, impaired flexibility, and pain.   ACTIVITY LIMITATIONS: carrying, lifting, sitting, standing, squatting, sleeping, stairs, transfers, and locomotion level  PARTICIPATION LIMITATIONS: community activity, occupation, and yard work  PERSONAL FACTORS: Age are also affecting patient's functional outcome.   REHAB POTENTIAL: Excellent  CLINICAL DECISION MAKING: Stable/uncomplicated  EVALUATION COMPLEXITY: Low   GOALS: Goals reviewed with patient? No  SHORT TERM GOALS: Target date: 03/26/2023  Pt and caregivers will be independent with HEP in order to demonstrate participation in  Physical Therapy POC.  Baseline: Goal status: MET  2.  Pt will report 10% increase in subjective functional performance in order to demonstrate improved functional capacity.  Baseline: Pt reports 40% Goal status: MET  LONG TERM GOALS: Target date: 04/16/2023  Pt will ambulate with nonantalgic symmetrical normal gait biomechanics for >500 feet in order to demonstrate increase in functional mobility. Baseline: See objective Goal status: INITIAL  2.  Pt will increase LLE MMT to +4/5 or equal to RLE MMT in order to demonstrate increased muscular strength gain. Baseline: See objective Goal status: INITIAL  3.  Pt will improve L knee range of motion to 120 degrees or equal to R knee range of motion to improve upright functional mobility. Baseline: See objective Goal status: MET  4.  Pt will improve LFES score by >50% of original score in order  to demonstrate improved functional use of LLE. Baseline: see objective Goal status: MET  PLAN:  PT FREQUENCY: 2x/week  PT DURATION: 6 weeks  PLANNED INTERVENTIONS: Therapeutic exercises, Therapeutic activity, Neuromuscular re-education, Balance training, Gait training, Patient/Family education, Self Care, Joint mobilization, Joint manipulation, Stair training, Dry Needling, Electrical stimulation, Cryotherapy, scar mobilization, Manual therapy, and Re-evaluation  PLAN FOR NEXT SESSION: L quad strengthening, has to reschedule appt for Dr. Romeo Apple follow up  2:16 PM, 05/21/23 Binnie Droessler Small Annasophia Crocker MPT [facility] [place] [phone] Ph:[phone]

## 2023-05-25 ENCOUNTER — Ambulatory Visit (HOSPITAL_COMMUNITY): Payer: Medicaid Other

## 2023-05-25 ENCOUNTER — Telehealth: Payer: Self-pay | Admitting: Orthopedic Surgery

## 2023-05-25 DIAGNOSIS — M6281 Muscle weakness (generalized): Secondary | ICD-10-CM | POA: Diagnosis not present

## 2023-05-25 DIAGNOSIS — S82035D Nondisplaced transverse fracture of left patella, subsequent encounter for closed fracture with routine healing: Secondary | ICD-10-CM | POA: Diagnosis not present

## 2023-05-25 DIAGNOSIS — M25662 Stiffness of left knee, not elsewhere classified: Secondary | ICD-10-CM

## 2023-05-25 NOTE — Telephone Encounter (Signed)
Patient missed appointment due to his mother passing away Can someone please call him to reschedule ? Thanks

## 2023-05-25 NOTE — Therapy (Signed)
OUTPATIENT PHYSICAL THERAPY TREATMENT     Patient Name: Frederick Stewart MRN: [id number] DOB:02-10-2001, 22 y.o., male Today's Date: 05/25/2023  END OF SESSION: END OF SESSION:   PT End of Session - 05/25/23 1513     Visit Number 13    Number of Visits 20    Date for PT Re-Evaluation 06/02/23    Authorization Type medicaid [employer]    Authorization Time Period approved 5 visit from 05/22/23 to 07/20/23    Authorization - Visit Number 1    Authorization - Number of Visits 5    Progress Note Due on Visit 5    PT Start Time 0235    PT Stop Time 0315    PT Time Calculation (min) 40 min    Activity Tolerance Patient tolerated treatment well    Behavior During Therapy [facility] for tasks assessed/performed                 No past medical history on file. Past Surgical History:  Procedure Laterality Date   ORIF PATELLA Left 01/27/2023   Procedure: EXCISION OF PATELLA FRAGMENT, PATELLA TENDON ADVANCEMENT;  Surgeon: Vickki Hearing, MD;  Location: [facility];  Service: Orthopedics;  Laterality: Left;   Patient Active Problem List   Diagnosis Date Noted   Closed nondisplaced transverse fracture of patella with routine healing 01/29/2023   Patellar tendon rupture, left, subsequent encounter repair 01/27/23 01/29/2023   Eczema 05/29/2015   Sports physical 08/21/2013   Prolonged Q-T interval on ECG 08/21/2013   Bradycardia 08/21/2013   Well child check 08/18/2013   Need for prophylactic vaccination and inoculation against influenza 08/18/2013   BMI (body mass index), pediatric, 5% to less than 85% for age 13/24/2014   Acne 08/18/2013    PCP: NA  REFERRING PROVIDER: Vickki Hearing MD  REFERRING DIAG:  S82.035D (ICD-10-CM) - Closed nondisplaced transverse fracture of left patella with routine healing, subsequent encounter  S86.812D (ICD-10-CM) - Patellar tendon rupture, left, subsequent encounter    THERAPY DIAG:  Closed nondisplaced transverse fracture of left patella  with routine healing  Muscle weakness (generalized)  Decreased range of motion (ROM) of left knee  Rationale for Evaluation and Treatment: Rehabilitation  ONSET DATE: 01/15/2023  SUBJECTIVE:   SUBJECTIVE STATEMENT: A little pain before the rain but otherwise doing fine. Has had to miss therapy due to recent passing of his mother.    PERTINENT HISTORY: ORIF Patella fracture Patellar Tendon Tear PAIN:  Are you having pain? No "sometimes some popping"  PRECAUTIONS: Other: Locked brace @ 60 degrees. Brace and PT  advancement as tolerated.   WEIGHT BEARING RESTRICTIONS:  WBAT  FALLS:  Has patient fallen in last 6 months? No  LIVING ENVIRONMENT: Lives with: lives with their spouse Lives in: House/apartment Stairs:  No stairs currently since living at [facility].  Has following equipment at home: None  OCCUPATION: "nothing right now"  PLOF: Independent  PATIENT GOALS: "walk normal"  NEXT MD VISIT: 04/01/2023; 05/06/23  OBJECTIVE:   DIAGNOSTIC FINDINGS:   PATIENT SURVEYS:  LEFS 40/80  COGNITION: Overall cognitive status: Within functional limits for tasks assessed     SENSATION: WFL  POSTURE: No Significant postural limitations  PALPATION: Mild TTP along lateral L knee midline lateral to incision  LOWER EXTREMITY ROM:  Active ROM Right eval Left eval Left  03/08/23 Left 03/23/23 Left 04/09/2023 Left 05/03/23  Hip flexion        Hip extension  Hip abduction        Hip adduction        Hip internal rotation        Hip external rotation        Knee flexion 135 55 50 AAROM 70 AROM 95 110 126  Knee extension 0 0 2 hyperextension  2 hyperextension Equal to right  Ankle dorsiflexion        Ankle plantarflexion        Ankle inversion        Ankle eversion         (Blank rows = not tested)  LOWER EXTREMITY MMT:  MMT Right eval Left eval Left 05/03/23  Hip flexion 4- 2+(pain & L knee) 4  Hip extension     Hip abduction 4-    Hip adduction     Hip  internal rotation     Hip external rotation     Knee flexion   4 (sitting)  Knee extension 5 1 4-  Ankle dorsiflexion     Ankle plantarflexion     Ankle inversion     Ankle eversion      (Blank rows = not tested)   FUNCTIONAL TESTS:  2 minute walk test: 265 ft  GAIT: Distance walked: 263ft Assistive device utilized: None Level of assistance: Complete Independence Comments: Antalgic gait pattern with LLE circumduction. W/ knee brace locked @ 60 degrees.    TODAY'S TREATMENT:                                                                                                                              DATE:  05/25/23 Recumbent bike x 5'; seat 12; level 3 full revolutions dynamic warm up   Bodycraft Knee extension 2 plates 2 x 10 16# (10# dumbells each hand) calf raises on incline x 20 Hamstring stretch 3 x 20" each Calf stretch 3 x 20" each  Sit to stand 20# weight with right foot extended 2 x 10  SLS ball toss red ball 2 x 20 on blue foam 10# weight sumo squat 2 x 10 3 plates left leg only 2 x 10   05/21/23 Recumbent bike x 5'; seat 12; full revolutions dynamic warm up   Sit to stand 10# weight with right foot extended out 2 x 10 Split squat 10#  2 x 10 Calf raises x 10 both, x 10 left leg Calf stretch on step 3 x 20" Left leg 3 plates leg press 3 x 10 Quad stretch in standing 5 x 20"  05/03/23 Recumbent bike x 5'; seat 12; full revolutions dynamic warm up   SLR without lag 2 x 10 AROM and MMT's see above LEFS 53/80 66.3% 2 MWT 414 ft Girth 16 cm about superior pole of patella  right quad 48 cm  left quad 42.75 cm  Leg press bodycraft 3 plates 2 x 10      04/30/2023 Recumbent bike x 7'; seat  12; full revolutions dynamic warm up  Body craft Single leg press 2 plates 2 x 10 Cybex hamstring curls 2.5 plates 2 x 10 Single leg deadlift 10#  2 x 10 Lateral step over cones x 5 with thick blue band      04/09/2023  -Recumbent bike x 7'; seat 12; full revolutions.   -Modified wall sit w/ 2in under RLE; 10 x 7' hold. Visual cues with mirror.  -2in stepups x 20; cues for proper movement patterns and no UE support -Forward eccentric 2in step downs w/ LLE x 5 BUE support.  -Heel raises; 2up;1down w/ LLE x 20 -2in box for 1 x 20 LLE Knee flexionw/ 2lb ankle weight.  -1 x 10 with 4lb ankle weight  04/06/2023  -Recumbent bike x 7'; seat 13; Full rotation. -45 degree wall sits w/ mirror for visual cues 10x 7-10' -2x 15 RTB standing hip adduction  -2x 15 RTB standing hip abduction -Heel raises; bilateral concentric, unilateral eccentric with LLE x 20.  -2in stepups w/o BUE support. x20 -2in stepdowns w/ single UE support. X20 -bodycraft TKE #1 plate x 12  05/03/2955  -Recumbent Bike x7; achieving full rotations. Seat 14 -2in stepups with RUE support x 10;  -Blue TB TKE x 30 -Mini wall squats 15 x 5' hold  -Standing knee flexion w/ 2lb ankle weight 2 x 12 repetitions.  -Standing hip abduction 1 x 20 repetitions.  -65ft gait training. Cues for continue L weight shift and knee flexion during swing.     PATIENT EDUCATION:  Education details:03/12/2023 Using towel underneath for quad set.  Education method: Medical illustrator Education comprehension: verbalized understanding and returned demonstration  HOME EXERCISE PROGRAM: Access Code: [email or web address] URL: [email or web address]  03/08/23- Standing Heel Raises  - 3 x daily - 7 x weekly - 2 sets - 20 reps - Toe Raises with Counter Support  - 3 x daily - 7 x weekly - 2 sets - 20 reps - Mini Squat with Counter Support  - 3 x daily - 7 x weekly - 3 sets - 10 reps  Date: 03/05/2023 - Sidelying Hip Abduction  - 1 x daily - 7 x weekly - 3 sets - 10 reps - Sidelying Hip Adduction  - 1 x daily - 7 x weekly - 3 sets - 10 reps - Prone Hip Extension  - 1 x daily - 7 x weekly - 3 sets - 10 reps - Supine Quadricep Sets  - 1 x daily - 7 x weekly - 3 sets - 10 reps - Supine Heel Slide  - 1 x daily  - 7 x weekly - 3 sets - 10 reps - Long Sitting Ankle Plantar Flexion with Resistance  - 1 x daily - 7 x weekly - 3 sets - 10 reps - Long Sitting Ankle Dorsiflexion with Anchored Resistance  - 1 x daily - 7 x weekly - 3 sets - 10 reps - Long Sitting Ankle Inversion with Resistance  - 1 x daily - 7 x weekly - 3 sets - 10 reps - Long Sitting Ankle Eversion with Resistance  - 1 x daily - 7 x weekly - 3 sets - 10 reps  ASSESSMENT:  CLINICAL IMPRESSION: Today's session continued to focus on lower extremity strengthening. Added knee extensions today for quad strengthening focus. Increased weight with sit to stand exercise (modified single leg squat). Added SLS ball toss for balance and improved proprioception.  States he has a hard time trusting the left knee  as he needs cues to control descent with single leg squat exercise. Noted increased calf tone left today.    Patient will benefit from continued skilled therapy services to address deficits and promote return to optimal function.      OBJECTIVE IMPAIRMENTS: Abnormal gait, decreased mobility, decreased ROM, decreased strength, hypomobility, impaired flexibility, and pain.   ACTIVITY LIMITATIONS: carrying, lifting, sitting, standing, squatting, sleeping, stairs, transfers, and locomotion level  PARTICIPATION LIMITATIONS: community activity, occupation, and yard work  PERSONAL FACTORS: Age are also affecting patient's functional outcome.   REHAB POTENTIAL: Excellent  CLINICAL DECISION MAKING: Stable/uncomplicated  EVALUATION COMPLEXITY: Low   GOALS: Goals reviewed with patient? No  SHORT TERM GOALS: Target date: 03/26/2023  Pt and caregivers will be independent with HEP in order to demonstrate participation in Physical Therapy POC.  Baseline: Goal status: MET  2.  Pt will report 10% increase in subjective functional performance in order to demonstrate improved functional capacity.  Baseline: Pt reports 40% Goal status: MET  LONG TERM  GOALS: Target date: 04/16/2023  Pt will ambulate with nonantalgic symmetrical normal gait biomechanics for >500 feet in order to demonstrate increase in functional mobility. Baseline: See objective Goal status: INITIAL  2.  Pt will increase LLE MMT to +4/5 or equal to RLE MMT in order to demonstrate increased muscular strength gain. Baseline: See objective Goal status: INITIAL  3.  Pt will improve L knee range of motion to 120 degrees or equal to R knee range of motion to improve upright functional mobility. Baseline: See objective Goal status: MET  4.  Pt will improve LFES score by >50% of original score in order to demonstrate improved functional use of LLE. Baseline: see objective Goal status: MET  PLAN:  PT FREQUENCY: 2x/week  PT DURATION: 6 weeks  PLANNED INTERVENTIONS: Therapeutic exercises, Therapeutic activity, Neuromuscular re-education, Balance training, Gait training, Patient/Family education, Self Care, Joint mobilization, Joint manipulation, Stair training, Dry Needling, Electrical stimulation, Cryotherapy, scar mobilization, Manual therapy, and Re-evaluation  PLAN FOR NEXT SESSION: L quad strengthening, has to reschedule appt for Dr. Romeo Apple follow up  3:15 PM, 05/25/23 Vyolet Sakuma Small Alaa Mullally MPT [place] [facility] [place] [phone] [phone]

## 2023-05-27 ENCOUNTER — Encounter: Payer: Self-pay | Admitting: Orthopedic Surgery

## 2023-05-27 ENCOUNTER — Ambulatory Visit: Payer: Medicaid Other | Admitting: Orthopedic Surgery

## 2023-05-27 ENCOUNTER — Ambulatory Visit (HOSPITAL_COMMUNITY): Payer: Medicaid Other | Attending: Orthopedic Surgery

## 2023-05-27 DIAGNOSIS — Z9889 Other specified postprocedural states: Secondary | ICD-10-CM

## 2023-05-27 DIAGNOSIS — S82035D Nondisplaced transverse fracture of left patella, subsequent encounter for closed fracture with routine healing: Secondary | ICD-10-CM | POA: Diagnosis not present

## 2023-05-27 DIAGNOSIS — M6281 Muscle weakness (generalized): Secondary | ICD-10-CM | POA: Diagnosis not present

## 2023-05-27 DIAGNOSIS — M25662 Stiffness of left knee, not elsewhere classified: Secondary | ICD-10-CM | POA: Insufficient documentation

## 2023-05-27 NOTE — Therapy (Signed)
OUTPATIENT PHYSICAL THERAPY TREATMENT     Patient Name: Frederick Stewart MRN: [id number] DOB:April 10, 2001, 22 y.o., male Today's Date: 05/27/2023  END OF SESSION: END OF SESSION:   PT End of Session - 05/27/23 1435     Visit Number 14    Number of Visits 20    Date for PT Re-Evaluation 06/02/23    Authorization Type medicaid [employer]    Authorization Time Period approved 5 visit from 05/22/23 to 07/20/23    Authorization - Visit Number 2    Authorization - Number of Visits 5    Progress Note Due on Visit 5    PT Start Time 1435    PT Stop Time 1515    PT Time Calculation (min) 40 min    Activity Tolerance Patient tolerated treatment well    Behavior During Therapy [facility] for tasks assessed/performed                 No past medical history on file. Past Surgical History:  Procedure Laterality Date   ORIF PATELLA Left 01/27/2023   Procedure: EXCISION OF PATELLA FRAGMENT, PATELLA TENDON ADVANCEMENT;  Surgeon: Vickki Hearing, MD;  Location: [facility];  Service: Orthopedics;  Laterality: Left;   Patient Active Problem List   Diagnosis Date Noted   Closed nondisplaced transverse fracture of patella with routine healing 01/29/2023   Patellar tendon rupture, left, subsequent encounter repair 01/27/23 01/29/2023   Eczema 05/29/2015   Sports physical 08/21/2013   Prolonged Q-T interval on ECG 08/21/2013   Bradycardia 08/21/2013   Well child check 08/18/2013   Need for prophylactic vaccination and inoculation against influenza 08/18/2013   BMI (body mass index), pediatric, 5% to less than 85% for age 73/24/2014   Acne 08/18/2013    PCP: NA  REFERRING PROVIDER: Vickki Hearing MD  REFERRING DIAG:  S82.035D (ICD-10-CM) - Closed nondisplaced transverse fracture of left patella with routine healing, subsequent encounter  S86.812D (ICD-10-CM) - Patellar tendon rupture, left, subsequent encounter    THERAPY DIAG:  Closed nondisplaced transverse fracture of left patella  with routine healing  Muscle weakness (generalized)  Decreased range of motion (ROM) of left knee  Rationale for Evaluation and Treatment: Rehabilitation  ONSET DATE: 01/15/2023  SUBJECTIVE:   SUBJECTIVE STATEMENT: Some soreness quad and glute after last visit; saw Dr. Romeo Apple and he was pleased with his progress; returns to him in 3 months PERTINENT HISTORY: ORIF Patella fracture Patellar Tendon Tear PAIN:  Are you having pain? No "sometimes some popping"  PRECAUTIONS: Other: Locked brace @ 60 degrees. Brace and PT  advancement as tolerated.   WEIGHT BEARING RESTRICTIONS:  WBAT  FALLS:  Has patient fallen in last 6 months? No  LIVING ENVIRONMENT: Lives with: lives with their spouse Lives in: House/apartment Stairs:  No stairs currently since living at [facility].  Has following equipment at home: None  OCCUPATION: "nothing right now"  PLOF: Independent  PATIENT GOALS: "walk normal"  NEXT MD VISIT: 04/01/2023; 05/06/23  OBJECTIVE:   DIAGNOSTIC FINDINGS:   PATIENT SURVEYS:  LEFS 40/80  COGNITION: Overall cognitive status: Within functional limits for tasks assessed     SENSATION: WFL  POSTURE: No Significant postural limitations  PALPATION: Mild TTP along lateral L knee midline lateral to incision  LOWER EXTREMITY ROM:  Active ROM Right eval Left eval Left  03/08/23 Left 03/23/23 Left 04/09/2023 Left 05/03/23  Hip flexion        Hip extension        Hip  abduction        Hip adduction        Hip internal rotation        Hip external rotation        Knee flexion 135 55 50 AAROM 70 AROM 95 110 126  Knee extension 0 0 2 hyperextension  2 hyperextension Equal to right  Ankle dorsiflexion        Ankle plantarflexion        Ankle inversion        Ankle eversion         (Blank rows = not tested)  LOWER EXTREMITY MMT:  MMT Right eval Left eval Left 05/03/23  Hip flexion 4- 2+(pain & L knee) 4  Hip extension     Hip abduction 4-    Hip adduction      Hip internal rotation     Hip external rotation     Knee flexion   4 (sitting)  Knee extension 5 1 4-  Ankle dorsiflexion     Ankle plantarflexion     Ankle inversion     Ankle eversion      (Blank rows = not tested)   FUNCTIONAL TESTS:  2 minute walk test: 265 ft  GAIT: Distance walked: 225ft Assistive device utilized: None Level of assistance: Complete Independence Comments: Antalgic gait pattern with LLE circumduction. W/ knee brace locked @ 60 degrees.    TODAY'S TREATMENT:                                                                                                                              DATE:  05/27/23 Ellipitical x 3' dynamic warm up  20# calf raises on incline x 20 20# dead lifts 2 x 10 70# sled push pull x 25 ft down and back x 5 3 plates left leg press 2 x 15; 4 plates x 8 13# backwards lunges 3 x 10 Calf stretch on step 3 x 20" Hamstring stretching 3 x 20" Quad stretch standing 3 x 20" Step downs; right heel tap 7" step 2 x 10       05/25/23 Recumbent bike x 5'; seat 12; level 3 full revolutions dynamic warm up   Bodycraft Knee extension 2 plates 2 x 10 08# (10# dumbells each hand) calf raises on incline x 20 Hamstring stretch 3 x 20" each Calf stretch 3 x 20" each  Sit to stand 20# weight with right foot extended 2 x 10  SLS ball toss red ball 2 x 20 on blue foam 10# weight sumo squat 2 x 10 3 plates  leg press left only 2 x 10   05/21/23 Recumbent bike x 5'; seat 12; full revolutions dynamic warm up   Sit to stand 10# weight with right foot extended out 2 x 10 Split squat 10#  2 x 10 Calf raises x 10 both, x 10 left leg Calf stretch on step 3 x 20" Left  leg 3 plates leg press 3 x 10 Quad stretch in standing 5 x 20"  05/03/23 Recumbent bike x 5'; seat 12; full revolutions dynamic warm up   SLR without lag 2 x 10 AROM and MMT's see above LEFS 53/80 66.3% 2 MWT 414 ft Girth 16 cm about superior pole of patella  right quad 48 cm   left quad 42.75 cm  Leg press bodycraft 3 plates 2 x 10  12/04/5282 Recumbent bike x 7'; seat 12; full revolutions dynamic warm up  Body craft Single leg press 2 plates 2 x 10 Cybex hamstring curls 2.5 plates 2 x 10 Single leg deadlift 10#  2 x 10 Lateral step over cones x 5 with thick blue band      04/09/2023  -Recumbent bike x 7'; seat 12; full revolutions.  -Modified wall sit w/ 2in under RLE; 10 x 7' hold. Visual cues with mirror.  -2in stepups x 20; cues for proper movement patterns and no UE support -Forward eccentric 2in step downs w/ LLE x 5 BUE support.  -Heel raises; 2up;1down w/ LLE x 20 -2in box for 1 x 20 LLE Knee flexionw/ 2lb ankle weight.  -1 x 10 with 4lb ankle weight  04/06/2023  -Recumbent bike x 7'; seat 13; Full rotation. -45 degree wall sits w/ mirror for visual cues 10x 7-10' -2x 15 RTB standing hip adduction  -2x 15 RTB standing hip abduction -Heel raises; bilateral concentric, unilateral eccentric with LLE x 20.  -2in stepups w/o BUE support. x20 -2in stepdowns w/ single UE support. X20 -bodycraft TKE #1 plate x 12  10/28/2438  -Recumbent Bike x7; achieving full rotations. Seat 14 -2in stepups with RUE support x 10;  -Blue TB TKE x 30 -Mini wall squats 15 x 5' hold  -Standing knee flexion w/ 2lb ankle weight 2 x 12 repetitions.  -Standing hip abduction 1 x 20 repetitions.  -41ft gait training. Cues for continue L weight shift and knee flexion during swing.     PATIENT EDUCATION:  Education details:03/12/2023 Using towel underneath for quad set.  Education method: Medical illustrator Education comprehension: verbalized understanding and returned demonstration  HOME EXERCISE PROGRAM: Access Code: [email or web address] URL: [email or web address]  03/08/23- Standing Heel Raises  - 3 x daily - 7 x weekly - 2 sets - 20 reps - Toe Raises with Counter Support  - 3 x daily - 7 x weekly - 2 sets - 20 reps - Mini Squat with Counter Support  - 3  x daily - 7 x weekly - 3 sets - 10 reps  Date: 03/05/2023 - Sidelying Hip Abduction  - 1 x daily - 7 x weekly - 3 sets - 10 reps - Sidelying Hip Adduction  - 1 x daily - 7 x weekly - 3 sets - 10 reps - Prone Hip Extension  - 1 x daily - 7 x weekly - 3 sets - 10 reps - Supine Quadricep Sets  - 1 x daily - 7 x weekly - 3 sets - 10 reps - Supine Heel Slide  - 1 x daily - 7 x weekly - 3 sets - 10 reps - Long Sitting Ankle Plantar Flexion with Resistance  - 1 x daily - 7 x weekly - 3 sets - 10 reps - Long Sitting Ankle Dorsiflexion with Anchored Resistance  - 1 x daily - 7 x weekly - 3 sets - 10 reps - Long Sitting Ankle Inversion with Resistance  - 1 x daily - 7 x  weekly - 3 sets - 10 reps - Long Sitting Ankle Eversion with Resistance  - 1 x daily - 7 x weekly - 3 sets - 10 reps  ASSESSMENT:  CLINICAL IMPRESSION: Today's session continued to focus on lower extremity strengthening and functional strength and movement. Started treatment with elliptical today for dynamic warm up.  Added sled push and pull to address functional strength with push pull activities.  Quad fatigue at the end of treatment today.   Patient will benefit from continued skilled therapy services to address deficits and promote return to optimal function.      OBJECTIVE IMPAIRMENTS: Abnormal gait, decreased mobility, decreased ROM, decreased strength, hypomobility, impaired flexibility, and pain.   ACTIVITY LIMITATIONS: carrying, lifting, sitting, standing, squatting, sleeping, stairs, transfers, and locomotion level  PARTICIPATION LIMITATIONS: community activity, occupation, and yard work  PERSONAL FACTORS: Age are also affecting patient's functional outcome.   REHAB POTENTIAL: Excellent  CLINICAL DECISION MAKING: Stable/uncomplicated  EVALUATION COMPLEXITY: Low   GOALS: Goals reviewed with patient? No  SHORT TERM GOALS: Target date: 03/26/2023  Pt and caregivers will be independent with HEP in order to  demonstrate participation in Physical Therapy POC.  Baseline: Goal status: MET  2.  Pt will report 10% increase in subjective functional performance in order to demonstrate improved functional capacity.  Baseline: Pt reports 40% Goal status: MET  LONG TERM GOALS: Target date: 04/16/2023  Pt will ambulate with nonantalgic symmetrical normal gait biomechanics for >500 feet in order to demonstrate increase in functional mobility. Baseline: See objective Goal status: INITIAL  2.  Pt will increase LLE MMT to +4/5 or equal to RLE MMT in order to demonstrate increased muscular strength gain. Baseline: See objective Goal status: INITIAL  3.  Pt will improve L knee range of motion to 120 degrees or equal to R knee range of motion to improve upright functional mobility. Baseline: See objective Goal status: MET  4.  Pt will improve LFES score by >50% of original score in order to demonstrate improved functional use of LLE. Baseline: see objective Goal status: MET  PLAN:  PT FREQUENCY: 2x/week  PT DURATION: 6 weeks  PLANNED INTERVENTIONS: Therapeutic exercises, Therapeutic activity, Neuromuscular re-education, Balance training, Gait training, Patient/Family education, Self Care, Joint mobilization, Joint manipulation, Stair training, Dry Needling, Electrical stimulation, Cryotherapy, scar mobilization, Manual therapy, and Re-evaluation  PLAN FOR NEXT SESSION: L quad strengthening, lifting technique next visit  3:14 PM, 05/27/23 Daiki Dicostanzo Small Algis Lehenbauer MPT [place] [facility] [place] [phone] Ph:[phone]

## 2023-05-27 NOTE — Progress Notes (Signed)
Chief Complaint  Patient presents with   Follow-up    Recheck on left knee, DOS 01-27-23.   Encounter Diagnoses  Name Primary?   Closed nondisplaced transverse fracture of left patella with routine healing, subsequent encounter Yes   S/P left knee surgery 01/27/23 REPAIR PAT TENDON     22 year old male status post repair of his left patellar tendon with excision of avulsion fracture  He has finally regained all his range of motion he is walking without support his incision looks good.  However he still have some weakness and atrophy in his quadriceps  He lost his mom to a bout of liver failure and cervical cancer.  He missed his last couple of appointments understandably  He will continue to work on strengthening and follow-up to see me in 3 months to check for strengthening

## 2023-05-31 ENCOUNTER — Ambulatory Visit (HOSPITAL_COMMUNITY): Payer: Medicaid Other

## 2023-05-31 DIAGNOSIS — M25662 Stiffness of left knee, not elsewhere classified: Secondary | ICD-10-CM | POA: Diagnosis not present

## 2023-05-31 DIAGNOSIS — S82035D Nondisplaced transverse fracture of left patella, subsequent encounter for closed fracture with routine healing: Secondary | ICD-10-CM

## 2023-05-31 DIAGNOSIS — M6281 Muscle weakness (generalized): Secondary | ICD-10-CM

## 2023-05-31 NOTE — Therapy (Signed)
OUTPATIENT PHYSICAL THERAPY TREATMENT     Patient Name: Frederick Stewart MRN: [id number] DOB:2001-02-18, 22 y.o., male Today's Date: 05/31/2023  END OF SESSION: END OF SESSION:   PT End of Session - 05/31/23 1432     Visit Number 15    Number of Visits 20    Date for PT Re-Evaluation 06/02/23    Authorization Type medicaid [employer]    Authorization Time Period approved 5 visit from 05/22/23 to 07/20/23    Authorization - Visit Number 3    Authorization - Number of Visits 5    Progress Note Due on Visit 5    PT Start Time 1431    PT Stop Time 1512    PT Time Calculation (min) 41 min    Activity Tolerance Patient tolerated treatment well    Behavior During Therapy [facility] for tasks assessed/performed                 No past medical history on file. Past Surgical History:  Procedure Laterality Date   ORIF PATELLA Left 01/27/2023   Procedure: EXCISION OF PATELLA FRAGMENT, PATELLA TENDON ADVANCEMENT;  Surgeon: Vickki Hearing, MD;  Location: [facility];  Service: Orthopedics;  Laterality: Left;   Patient Active Problem List   Diagnosis Date Noted   Closed nondisplaced transverse fracture of patella with routine healing 01/29/2023   Patellar tendon rupture, left, subsequent encounter repair 01/27/23 01/29/2023   Eczema 05/29/2015   Sports physical 08/21/2013   Prolonged Q-T interval on ECG 08/21/2013   Bradycardia 08/21/2013   Well child check 08/18/2013   Need for prophylactic vaccination and inoculation against influenza 08/18/2013   BMI (body mass index), pediatric, 5% to less than 85% for age 32/24/2014   Acne 08/18/2013    PCP: NA  REFERRING PROVIDER: Vickki Hearing MD  REFERRING DIAG:  S82.035D (ICD-10-CM) - Closed nondisplaced transverse fracture of left patella with routine healing, subsequent encounter  S86.812D (ICD-10-CM) - Patellar tendon rupture, left, subsequent encounter    THERAPY DIAG:  Closed nondisplaced transverse fracture of left patella  with routine healing  Muscle weakness (generalized)  Decreased range of motion (ROM) of left knee  Rationale for Evaluation and Treatment: Rehabilitation  ONSET DATE: 01/15/2023  SUBJECTIVE:   SUBJECTIVE STATEMENT: Some soreness quad and glute after last visit; saw Dr. Romeo Apple and he was pleased with his progress; returns to him in 3 months PERTINENT HISTORY: ORIF Patella fracture Patellar Tendon Tear PAIN:  Are you having pain? No "sometimes some popping"  PRECAUTIONS: Other: Locked brace @ 60 degrees. Brace and PT  advancement as tolerated.   WEIGHT BEARING RESTRICTIONS:  WBAT  FALLS:  Has patient fallen in last 6 months? No  LIVING ENVIRONMENT: Lives with: lives with their spouse Lives in: House/apartment Stairs:  No stairs currently since living at [facility].  Has following equipment at home: None  OCCUPATION: "nothing right now"  PLOF: Independent  PATIENT GOALS: "walk normal"  NEXT MD VISIT: 04/01/2023; 05/06/23  OBJECTIVE:   DIAGNOSTIC FINDINGS:   PATIENT SURVEYS:  LEFS 40/80  COGNITION: Overall cognitive status: Within functional limits for tasks assessed     SENSATION: WFL  POSTURE: No Significant postural limitations  PALPATION: Mild TTP along lateral L knee midline lateral to incision  LOWER EXTREMITY ROM:  Active ROM Right eval Left eval Left  03/08/23 Left 03/23/23 Left 04/09/2023 Left 05/03/23  Hip flexion        Hip extension        Hip  abduction        Hip adduction        Hip internal rotation        Hip external rotation        Knee flexion 135 55 50 AAROM 70 AROM 95 110 126  Knee extension 0 0 2 hyperextension  2 hyperextension Equal to right  Ankle dorsiflexion        Ankle plantarflexion        Ankle inversion        Ankle eversion         (Blank rows = not tested)  LOWER EXTREMITY MMT:  MMT Right eval Left eval Left 05/03/23  Hip flexion 4- 2+(pain & L knee) 4  Hip extension     Hip abduction 4-    Hip adduction      Hip internal rotation     Hip external rotation     Knee flexion   4 (sitting)  Knee extension 5 1 4-  Ankle dorsiflexion     Ankle plantarflexion     Ankle inversion     Ankle eversion      (Blank rows = not tested)   FUNCTIONAL TESTS:  2 minute walk test: 265 ft  GAIT: Distance walked: 217ft Assistive device utilized: None Level of assistance: Complete Independence Comments: Antalgic gait pattern with LLE circumduction. W/ knee brace locked @ 60 degrees.    TODAY'S TREATMENT:                                                                                                                              DATE:  05/31/23 Ellipitical x 4' dynamic warm up Box lift table to floor 15# x 5 Box lift table to floor 30# x 5 Left leg press 4 plates 3 x 10 57# sled push pull x 25 ft down and back x 5 7" step downs 2 x 10 Line jumps F/B; and S/S 3 x 15" Tramp weight shifts/ bounce 3 x 30" Tramp ball toss SLS on foam 2 x 20 3 way tap standing on left leg 2 x 10   05/27/23 Ellipitical x 3' dynamic warm up  20# calf raises on incline x 20 20# dead lifts 2 x 10 70# sled push pull x 25 ft down and back x 5 3 plates left leg press 2 x 15; 4 plates x 8 84# backwards lunges 3 x 10 Calf stretch on step 3 x 20" Hamstring stretching 3 x 20" Quad stretch standing 3 x 20" Step downs; right heel tap 7" step 2 x 10  05/25/23 Recumbent bike x 5'; seat 12; level 3 full revolutions dynamic warm up   Bodycraft Knee extension 2 plates 2 x 10 69# (10# dumbells each hand) calf raises on incline x 20 Hamstring stretch 3 x 20" each Calf stretch 3 x 20" each  Sit to stand 20# weight with right foot extended 2 x 10  SLS  ball toss red ball 2 x 20 on blue foam 10# weight sumo squat 2 x 10 3 plates  leg press left only 2 x 10   05/21/23 Recumbent bike x 5'; seat 12; full revolutions dynamic warm up   Sit to stand 10# weight with right foot extended out 2 x 10 Split squat 10#  2 x 10 Calf raises  x 10 both, x 10 left leg Calf stretch on step 3 x 20" Left leg 3 plates leg press 3 x 10 Quad stretch in standing 5 x 20"  05/03/23 Recumbent bike x 5'; seat 12; full revolutions dynamic warm up   SLR without lag 2 x 10 AROM and MMT's see above LEFS 53/80 66.3% 2 MWT 414 ft Girth 16 cm about superior pole of patella  right quad 48 cm  left quad 42.75 cm  Leg press bodycraft 3 plates 2 x 10  10/31/1094 Recumbent bike x 7'; seat 12; full revolutions dynamic warm up  Body craft Single leg press 2 plates 2 x 10 Cybex hamstring curls 2.5 plates 2 x 10 Single leg deadlift 10#  2 x 10 Lateral step over cones x 5 with thick blue band      04/09/2023  -Recumbent bike x 7'; seat 12; full revolutions.  -Modified wall sit w/ 2in under RLE; 10 x 7' hold. Visual cues with mirror.  -2in stepups x 20; cues for proper movement patterns and no UE support -Forward eccentric 2in step downs w/ LLE x 5 BUE support.  -Heel raises; 2up;1down w/ LLE x 20 -2in box for 1 x 20 LLE Knee flexionw/ 2lb ankle weight.  -1 x 10 with 4lb ankle weight  04/06/2023  -Recumbent bike x 7'; seat 13; Full rotation. -45 degree wall sits w/ mirror for visual cues 10x 7-10' -2x 15 RTB standing hip adduction  -2x 15 RTB standing hip abduction -Heel raises; bilateral concentric, unilateral eccentric with LLE x 20.  -2in stepups w/o BUE support. x20 -2in stepdowns w/ single UE support. X20 -bodycraft TKE #1 plate x 12  0/01/5408  -Recumbent Bike x7; achieving full rotations. Seat 14 -2in stepups with RUE support x 10;  -Blue TB TKE x 30 -Mini wall squats 15 x 5' hold  -Standing knee flexion w/ 2lb ankle weight 2 x 12 repetitions.  -Standing hip abduction 1 x 20 repetitions.  -29ft gait training. Cues for continue L weight shift and knee flexion during swing.     PATIENT EDUCATION:  Education details:03/12/2023 Using towel underneath for quad set.  Education method: Medical illustrator Education  comprehension: verbalized understanding and returned demonstration  HOME EXERCISE PROGRAM: Access Code: [email or web address] URL: [email or web address]  03/08/23- Standing Heel Raises  - 3 x daily - 7 x weekly - 2 sets - 20 reps - Toe Raises with Counter Support  - 3 x daily - 7 x weekly - 2 sets - 20 reps - Mini Squat with Counter Support  - 3 x daily - 7 x weekly - 3 sets - 10 reps  Date: 03/05/2023 - Sidelying Hip Abduction  - 1 x daily - 7 x weekly - 3 sets - 10 reps - Sidelying Hip Adduction  - 1 x daily - 7 x weekly - 3 sets - 10 reps - Prone Hip Extension  - 1 x daily - 7 x weekly - 3 sets - 10 reps - Supine Quadricep Sets  - 1 x daily - 7 x weekly - 3 sets - 10 reps -  Supine Heel Slide  - 1 x daily - 7 x weekly - 3 sets - 10 reps - Long Sitting Ankle Plantar Flexion with Resistance  - 1 x daily - 7 x weekly - 3 sets - 10 reps - Long Sitting Ankle Dorsiflexion with Anchored Resistance  - 1 x daily - 7 x weekly - 3 sets - 10 reps - Long Sitting Ankle Inversion with Resistance  - 1 x daily - 7 x weekly - 3 sets - 10 reps - Long Sitting Ankle Eversion with Resistance  - 1 x daily - 7 x weekly - 3 sets - 10 reps  ASSESSMENT:  CLINICAL IMPRESSION: Today's session continued to focus on lower extremity strengthening and functional strength and movement. Added box lift today to simulate lifting at a job he is interested in; working on proper lifting form to avoid injury.  Patient able to demonstrate good form with minimal cues.  Able to increase leg press to 4 plates.started some agility work/jumping today as sports and exercise have been a big part of patient's life.  Max challenge with Patient works hard in therapy and is progressing well.  Patient will benefit from continued skilled therapy services to address deficits and promote return to optimal function.      OBJECTIVE IMPAIRMENTS: Abnormal gait, decreased mobility, decreased ROM, decreased strength, hypomobility, impaired  flexibility, and pain.   ACTIVITY LIMITATIONS: carrying, lifting, sitting, standing, squatting, sleeping, stairs, transfers, and locomotion level  PARTICIPATION LIMITATIONS: community activity, occupation, and yard work  PERSONAL FACTORS: Age are also affecting patient's functional outcome.   REHAB POTENTIAL: Excellent  CLINICAL DECISION MAKING: Stable/uncomplicated  EVALUATION COMPLEXITY: Low   GOALS: Goals reviewed with patient? No  SHORT TERM GOALS: Target date: 03/26/2023  Pt and caregivers will be independent with HEP in order to demonstrate participation in Physical Therapy POC.  Baseline: Goal status: MET  2.  Pt will report 10% increase in subjective functional performance in order to demonstrate improved functional capacity.  Baseline: Pt reports 40% Goal status: MET  LONG TERM GOALS: Target date: 04/16/2023  Pt will ambulate with nonantalgic symmetrical normal gait biomechanics for >500 feet in order to demonstrate increase in functional mobility. Baseline: See objective Goal status: INITIAL  2.  Pt will increase LLE MMT to +4/5 or equal to RLE MMT in order to demonstrate increased muscular strength gain. Baseline: See objective Goal status: INITIAL  3.  Pt will improve L knee range of motion to 120 degrees or equal to R knee range of motion to improve upright functional mobility. Baseline: See objective Goal status: MET  4.  Pt will improve LFES score by >50% of original score in order to demonstrate improved functional use of LLE. Baseline: see objective Goal status: MET  PLAN:  PT FREQUENCY: 2x/week  PT DURATION: 6 weeks  PLANNED INTERVENTIONS: Therapeutic exercises, Therapeutic activity, Neuromuscular re-education, Balance training, Gait training, Patient/Family education, Self Care, Joint mobilization, Joint manipulation, Stair training, Dry Needling, Electrical stimulation, Cryotherapy, scar mobilization, Manual therapy, and Re-evaluation  PLAN FOR  NEXT SESSION: L quad strengthening,add in higher level balance and agility as able to prepare for d/c to gym program  3:11 PM, 05/31/23 Devory Mckinzie Small Portia Wisdom MPT [facility] [place] [phone] [phone]

## 2023-06-03 ENCOUNTER — Encounter (HOSPITAL_COMMUNITY): Payer: Medicaid Other

## 2023-06-09 ENCOUNTER — Encounter (HOSPITAL_COMMUNITY): Payer: Medicaid Other

## 2023-06-10 ENCOUNTER — Telehealth (HOSPITAL_COMMUNITY): Payer: Self-pay

## 2023-06-10 NOTE — Telephone Encounter (Signed)
Called and spoke with patient about missed appointment yesterday and he states he overslept; plans to attend next appointment on Tuesday 8:20 at 1:45  11:49 AM, 06/10/23 Anitria Andon Small Kinzlee Selvy MPT [facility] [place] [phone] Ph:[phone]

## 2023-06-15 ENCOUNTER — Ambulatory Visit (HOSPITAL_COMMUNITY): Payer: Medicaid Other

## 2023-06-15 DIAGNOSIS — S82035D Nondisplaced transverse fracture of left patella, subsequent encounter for closed fracture with routine healing: Secondary | ICD-10-CM

## 2023-06-15 DIAGNOSIS — M6281 Muscle weakness (generalized): Secondary | ICD-10-CM

## 2023-06-15 DIAGNOSIS — M25662 Stiffness of left knee, not elsewhere classified: Secondary | ICD-10-CM | POA: Diagnosis not present

## 2023-06-15 NOTE — Therapy (Signed)
OUTPATIENT PHYSICAL THERAPY TREATMENT     Patient Name: Frederick Stewart MRN: [id number] DOB:Mar 13, 2001, 22 y.o., male Today's Date: 06/15/2023  END OF SESSION: END OF SESSION:   PT End of Session - 06/15/23 1355     Visit Number 16    Number of Visits 20    Date for PT Re-Evaluation 06/18/23    Authorization Type medicaid [employer]    Authorization Time Period approved 5 visit from 05/22/23 to 07/20/23    Authorization - Visit Number 4    Authorization - Number of Visits 5    Progress Note Due on Visit 5    PT Start Time 1350    PT Stop Time 1430    PT Time Calculation (min) 40 min    Activity Tolerance Patient tolerated treatment well    Behavior During Therapy [facility] for tasks assessed/performed                 No past medical history on file. Past Surgical History:  Procedure Laterality Date   ORIF PATELLA Left 01/27/2023   Procedure: EXCISION OF PATELLA FRAGMENT, PATELLA TENDON ADVANCEMENT;  Surgeon: Vickki Hearing, MD;  Location: [facility];  Service: Orthopedics;  Laterality: Left;   Patient Active Problem List   Diagnosis Date Noted   Closed nondisplaced transverse fracture of patella with routine healing 01/29/2023   Patellar tendon rupture, left, subsequent encounter repair 01/27/23 01/29/2023   Eczema 05/29/2015   Sports physical 08/21/2013   Prolonged Q-T interval on ECG 08/21/2013   Bradycardia 08/21/2013   Well child check 08/18/2013   Need for prophylactic vaccination and inoculation against influenza 08/18/2013   BMI (body mass index), pediatric, 5% to less than 85% for age 26/24/2014   Acne 08/18/2013    PCP: NA  REFERRING PROVIDER: Vickki Hearing MD  REFERRING DIAG:  S82.035D (ICD-10-CM) - Closed nondisplaced transverse fracture of left patella with routine healing, subsequent encounter  S86.812D (ICD-10-CM) - Patellar tendon rupture, left, subsequent encounter    THERAPY DIAG:  Closed nondisplaced transverse fracture of left patella  with routine healing  Muscle weakness (generalized)  Decreased range of motion (ROM) of left knee  Rationale for Evaluation and Treatment: Rehabilitation  ONSET DATE: 01/15/2023  SUBJECTIVE:   SUBJECTIVE STATEMENT: Some soreness quad and glute after last visit; saw Dr. Romeo Apple and he was pleased with his progress; returns to him in 3 months PERTINENT HISTORY: ORIF Patella fracture Patellar Tendon Tear PAIN:  Are you having pain? No "sometimes some popping"  PRECAUTIONS: Other: Locked brace @ 60 degrees. Brace and PT  advancement as tolerated.   WEIGHT BEARING RESTRICTIONS:  WBAT  FALLS:  Has patient fallen in last 6 months? No  LIVING ENVIRONMENT: Lives with: lives with their spouse Lives in: House/apartment Stairs:  No stairs currently since living at [facility].  Has following equipment at home: None  OCCUPATION: "nothing right now"  PLOF: Independent  PATIENT GOALS: "walk normal"  NEXT MD VISIT: 04/01/2023; 05/06/23  OBJECTIVE:   DIAGNOSTIC FINDINGS:   PATIENT SURVEYS:  LEFS 40/80  COGNITION: Overall cognitive status: Within functional limits for tasks assessed     SENSATION: WFL  POSTURE: No Significant postural limitations  PALPATION: Mild TTP along lateral L knee midline lateral to incision  LOWER EXTREMITY ROM:  Active ROM Right eval Left eval Left  03/08/23 Left 03/23/23 Left 04/09/2023 Left 05/03/23  Hip flexion        Hip extension        Hip  abduction        Hip adduction        Hip internal rotation        Hip external rotation        Knee flexion 135 55 50 AAROM 70 AROM 95 110 126  Knee extension 0 0 2 hyperextension  2 hyperextension Equal to right  Ankle dorsiflexion        Ankle plantarflexion        Ankle inversion        Ankle eversion         (Blank rows = not tested)  LOWER EXTREMITY MMT:  MMT Right eval Left eval Left 05/03/23  Hip flexion 4- 2+(pain & L knee) 4  Hip extension     Hip abduction 4-    Hip adduction      Hip internal rotation     Hip external rotation     Knee flexion   4 (sitting)  Knee extension 5 1 4-  Ankle dorsiflexion     Ankle plantarflexion     Ankle inversion     Ankle eversion      (Blank rows = not tested)   FUNCTIONAL TESTS:  2 minute walk test: 265 ft  GAIT: Distance walked: 22ft Assistive device utilized: None Level of assistance: Complete Independence Comments: Antalgic gait pattern with LLE circumduction. W/ knee brace locked @ 60 degrees.    TODAY'S TREATMENT:                                                                                                                              DATE:  06/15/23 Elliptical warm up x 4' Leg press 4 plates left leg only 3 x 10 Sled push/pull 70# x 20 ft x 8 reps SLS on foam red med ball toss 2 x 20 Jog, skip and carioca down hallway x 30 ft down and back each Sit to stand from low mat table 20# with right foot forward 2 x 10      05/31/23 Ellipitical x 4' dynamic warm up Box lift table to floor 15# x 5 Box lift table to floor 30# x 5 Left leg press 4 plates 3 x 10 32# sled push pull x 25 ft down and back x 5 7" step downs 2 x 10 Line jumps F/B; and S/S 3 x 15" Tramp weight shifts/ bounce 3 x 30" Tramp ball toss SLS on foam 2 x 20 3 way tap standing on left leg 2 x 10   05/27/23 Ellipitical x 3' dynamic warm up  20# calf raises on incline x 20 20# dead lifts 2 x 10 70# sled push pull x 25 ft down and back x 5 3 plates left leg press 2 x 15; 4 plates x 8 44# backwards lunges 3 x 10 Calf stretch on step 3 x 20" Hamstring stretching 3 x 20" Quad stretch standing 3 x 20" Step downs; right heel tap  7" step 2 x 10  05/25/23 Recumbent bike x 5'; seat 12; level 3 full revolutions dynamic warm up   Bodycraft Knee extension 2 plates 2 x 10 28# (10# dumbells each hand) calf raises on incline x 20 Hamstring stretch 3 x 20" each Calf stretch 3 x 20" each  Sit to stand 20# weight with right foot extended 2 x 10   SLS ball toss red ball 2 x 20 on blue foam 10# weight sumo squat 2 x 10 3 plates  leg press left only 2 x 10   05/21/23 Recumbent bike x 5'; seat 12; full revolutions dynamic warm up   Sit to stand 10# weight with right foot extended out 2 x 10 Split squat 10#  2 x 10 Calf raises x 10 both, x 10 left leg Calf stretch on step 3 x 20" Left leg 3 plates leg press 3 x 10 Quad stretch in standing 5 x 20"  05/03/23 Recumbent bike x 5'; seat 12; full revolutions dynamic warm up   SLR without lag 2 x 10 AROM and MMT's see above LEFS 53/80 66.3% 2 MWT 414 ft Girth 16 cm about superior pole of patella  right quad 48 cm  left quad 42.75 cm  Leg press bodycraft 3 plates 2 x 10  01/25/3243 Recumbent bike x 7'; seat 12; full revolutions dynamic warm up  Body craft Single leg press 2 plates 2 x 10 Cybex hamstring curls 2.5 plates 2 x 10 Single leg deadlift 10#  2 x 10 Lateral step over cones x 5 with thick blue band      04/09/2023  -Recumbent bike x 7'; seat 12; full revolutions.  -Modified wall sit w/ 2in under RLE; 10 x 7' hold. Visual cues with mirror.  -2in stepups x 20; cues for proper movement patterns and no UE support -Forward eccentric 2in step downs w/ LLE x 5 BUE support.  -Heel raises; 2up;1down w/ LLE x 20 -2in box for 1 x 20 LLE Knee flexionw/ 2lb ankle weight.  -1 x 10 with 4lb ankle weight  04/06/2023  -Recumbent bike x 7'; seat 13; Full rotation. -45 degree wall sits w/ mirror for visual cues 10x 7-10' -2x 15 RTB standing hip adduction  -2x 15 RTB standing hip abduction -Heel raises; bilateral concentric, unilateral eccentric with LLE x 20.  -2in stepups w/o BUE support. x20 -2in stepdowns w/ single UE support. X20 -bodycraft TKE #1 plate x 12  0/10/270  -Recumbent Bike x7; achieving full rotations. Seat 14 -2in stepups with RUE support x 10;  -Blue TB TKE x 30 -Mini wall squats 15 x 5' hold  -Standing knee flexion w/ 2lb ankle weight 2 x 12 repetitions.   -Standing hip abduction 1 x 20 repetitions.  -30ft gait training. Cues for continue L weight shift and knee flexion during swing.     PATIENT EDUCATION:  Education details:03/12/2023 Using towel underneath for quad set.  Education method: Medical illustrator Education comprehension: verbalized understanding and returned demonstration  HOME EXERCISE PROGRAM: Access Code: [email or web address] URL: [email or web address]  03/08/23- Standing Heel Raises  - 3 x daily - 7 x weekly - 2 sets - 20 reps - Toe Raises with Counter Support  - 3 x daily - 7 x weekly - 2 sets - 20 reps - Mini Squat with Counter Support  - 3 x daily - 7 x weekly - 3 sets - 10 reps  Date: 03/05/2023 - Sidelying Hip Abduction  - 1 x  daily - 7 x weekly - 3 sets - 10 reps - Sidelying Hip Adduction  - 1 x daily - 7 x weekly - 3 sets - 10 reps - Prone Hip Extension  - 1 x daily - 7 x weekly - 3 sets - 10 reps - Supine Quadricep Sets  - 1 x daily - 7 x weekly - 3 sets - 10 reps - Supine Heel Slide  - 1 x daily - 7 x weekly - 3 sets - 10 reps - Long Sitting Ankle Plantar Flexion with Resistance  - 1 x daily - 7 x weekly - 3 sets - 10 reps - Long Sitting Ankle Dorsiflexion with Anchored Resistance  - 1 x daily - 7 x weekly - 3 sets - 10 reps - Long Sitting Ankle Inversion with Resistance  - 1 x daily - 7 x weekly - 3 sets - 10 reps - Long Sitting Ankle Eversion with Resistance  - 1 x daily - 7 x weekly - 3 sets - 10 reps  ASSESSMENT:  CLINICAL IMPRESSION: Today's session continued to work on left leg strengthening, balance, functional strength and return to sports type activity.  Noted decreased push off with jogging left leg but able to skip and carioca with only mild deviations. Patient progressing well with therapy.  Likely ready for discharge next visit/reassess next visit for discharge.    Today's session continued to focus on lower extremity strengthening and functional strength and movement. Added box lift  today to simulate lifting at a job he is interested in; working on proper lifting form to avoid injury.  Patient able to demonstrate good form with minimal cues.  Able to increase leg press to 4 plates.started some agility work/jumping today as sports and exercise have been a big part of patient's life.  Max challenge with Patient works hard in therapy and is progressing well.  Patient will benefit from continued skilled therapy services to address deficits and promote return to optimal function.      OBJECTIVE IMPAIRMENTS: Abnormal gait, decreased mobility, decreased ROM, decreased strength, hypomobility, impaired flexibility, and pain.   ACTIVITY LIMITATIONS: carrying, lifting, sitting, standing, squatting, sleeping, stairs, transfers, and locomotion level  PARTICIPATION LIMITATIONS: community activity, occupation, and yard work  PERSONAL FACTORS: Age are also affecting patient's functional outcome.   REHAB POTENTIAL: Excellent  CLINICAL DECISION MAKING: Stable/uncomplicated  EVALUATION COMPLEXITY: Low   GOALS: Goals reviewed with patient? No  SHORT TERM GOALS: Target date: 03/26/2023  Pt and caregivers will be independent with HEP in order to demonstrate participation in Physical Therapy POC.  Baseline: Goal status: MET  2.  Pt will report 10% increase in subjective functional performance in order to demonstrate improved functional capacity.  Baseline: Pt reports 40% Goal status: MET  LONG TERM GOALS: Target date: 04/16/2023  Pt will ambulate with nonantalgic symmetrical normal gait biomechanics for >500 feet in order to demonstrate increase in functional mobility. Baseline: See objective Goal status: INITIAL  2.  Pt will increase LLE MMT to +4/5 or equal to RLE MMT in order to demonstrate increased muscular strength gain. Baseline: See objective Goal status: INITIAL  3.  Pt will improve L knee range of motion to 120 degrees or equal to R knee range of motion to improve  upright functional mobility. Baseline: See objective Goal status: MET  4.  Pt will improve LFES score by >50% of original score in order to demonstrate improved functional use of LLE. Baseline: see objective Goal status: MET  PLAN:  PT FREQUENCY: 2x/week  PT DURATION: 6 weeks  PLANNED INTERVENTIONS: Therapeutic exercises, Therapeutic activity, Neuromuscular re-education, Balance training, Gait training, Patient/Family education, Self Care, Joint mobilization, Joint manipulation, Stair training, Dry Needling, Electrical stimulation, Cryotherapy, scar mobilization, Manual therapy, and Re-evaluation  PLAN FOR NEXT SESSION: L quad strengthening,add in higher level balance and agility as able to prepare for d/c to gym program; reassess next visit likely dc  2:28 PM, 06/15/23 Frederick Stewart Frederick Stewart Frederick Stewart MPT [facility] [place] [phone] [phone]

## 2023-06-17 ENCOUNTER — Encounter (HOSPITAL_COMMUNITY): Payer: Medicaid Other

## 2023-06-18 ENCOUNTER — Ambulatory Visit (HOSPITAL_COMMUNITY): Payer: Medicaid Other

## 2023-06-18 DIAGNOSIS — M6281 Muscle weakness (generalized): Secondary | ICD-10-CM

## 2023-06-18 DIAGNOSIS — S82035D Nondisplaced transverse fracture of left patella, subsequent encounter for closed fracture with routine healing: Secondary | ICD-10-CM

## 2023-06-18 DIAGNOSIS — M25662 Stiffness of left knee, not elsewhere classified: Secondary | ICD-10-CM | POA: Diagnosis not present

## 2023-06-18 NOTE — Therapy (Signed)
OUTPATIENT PHYSICAL THERAPY  DISCHARGE  PHYSICAL THERAPY DISCHARGE SUMMARY  Visits from Start of Care: 17  Current functional level related to goals / functional outcomes: See below   Remaining deficits: See below   Education / Equipment: HEP   Patient agrees to discharge. Patient goals were met. Patient is being discharged due to meeting the stated rehab goals.      Patient Name: Frederick Stewart MRN: [id number] DOB:Aug 03, 2001, 22 y.o., male Today's Date: 06/18/2023  END OF SESSION: END OF SESSION:   PT End of Session - 06/18/23 1259     Visit Number 17    Number of Visits 20    Date for PT Re-Evaluation 06/18/23    Authorization Type medicaid [employer]    Authorization Time Period approved 5 visit from 05/22/23 to 07/20/23    Authorization - Visit Number 5    Authorization - Number of Visits 5    Progress Note Due on Visit 5    PT Start Time 1300    PT Stop Time 1325    PT Time Calculation (min) 25 min    Activity Tolerance Patient tolerated treatment well    Behavior During Therapy [facility] for tasks assessed/performed                 No past medical history on file. Past Surgical History:  Procedure Laterality Date   ORIF PATELLA Left 01/27/2023   Procedure: EXCISION OF PATELLA FRAGMENT, PATELLA TENDON ADVANCEMENT;  Surgeon: Vickki Hearing, MD;  Location: [facility];  Service: Orthopedics;  Laterality: Left;   Patient Active Problem List   Diagnosis Date Noted   Closed nondisplaced transverse fracture of patella with routine healing 01/29/2023   Patellar tendon rupture, left, subsequent encounter repair 01/27/23 01/29/2023   Eczema 05/29/2015   Sports physical 08/21/2013   Prolonged Q-T interval on ECG 08/21/2013   Bradycardia 08/21/2013   Well child check 08/18/2013   Need for prophylactic vaccination and inoculation against influenza 08/18/2013   BMI (body mass index), pediatric, 5% to less than 85% for age 99/24/2014   Acne 08/18/2013    PCP:  NA  REFERRING PROVIDER: Vickki Hearing MD  REFERRING DIAG:  S82.035D (ICD-10-CM) - Closed nondisplaced transverse fracture of left patella with routine healing, subsequent encounter  S86.812D (ICD-10-CM) - Patellar tendon rupture, left, subsequent encounter    THERAPY DIAG:  Closed nondisplaced transverse fracture of left patella with routine healing  Muscle weakness (generalized)  Decreased range of motion (ROM) of left knee  Rationale for Evaluation and Treatment: Rehabilitation  ONSET DATE: 01/15/2023  SUBJECTIVE:   SUBJECTIVE STATEMENT: Feels good; ready for discharge PERTINENT HISTORY: ORIF Patella fracture Patellar Tendon Tear PAIN:  Are you having pain? No "sometimes some popping"  PRECAUTIONS: Other: Locked brace @ 60 degrees. Brace and PT  advancement as tolerated.   WEIGHT BEARING RESTRICTIONS:  WBAT  FALLS:  Has patient fallen in last 6 months? No  LIVING ENVIRONMENT: Lives with: lives with their spouse Lives in: House/apartment Stairs:  No stairs currently since living at [facility].  Has following equipment at home: None  OCCUPATION: "nothing right now"  PLOF: Independent  PATIENT GOALS: "walk normal"  NEXT MD VISIT: 04/01/2023; 05/06/23  OBJECTIVE:   DIAGNOSTIC FINDINGS:   PATIENT SURVEYS:  LEFS 40/80  COGNITION: Overall cognitive status: Within functional limits for tasks assessed     SENSATION: WFL  POSTURE: No Significant postural limitations  PALPATION: Mild TTP along lateral L knee midline lateral to incision  LOWER EXTREMITY ROM:  Active ROM Right eval Left eval Left  03/08/23 Left 03/23/23 Left 04/09/2023 Left 05/03/23 Left 06/18/23  Hip flexion         Hip extension         Hip abduction         Hip adduction         Hip internal rotation         Hip external rotation         Knee flexion 135 55 50 AAROM 70 AROM 95 110 126 130  Knee extension 0 0 2 hyperextension  2 hyperextension Equal to right 0  Ankle  dorsiflexion         Ankle plantarflexion         Ankle inversion         Ankle eversion          (Blank rows = not tested)  LOWER EXTREMITY MMT:  MMT Right eval Left eval Left 05/03/23 Left 06/18/23  Hip flexion 4- 2+(pain & L knee) 4 5  Hip extension      Hip abduction 4-     Hip adduction      Hip internal rotation      Hip external rotation      Knee flexion   4 (sitting) 5  Knee extension 5 1 4- 5  Ankle dorsiflexion      Ankle plantarflexion      Ankle inversion      Ankle eversion       (Blank rows = not tested)   FUNCTIONAL TESTS:  2 minute walk test: 265 ft  GAIT: Distance walked: 280ft Assistive device utilized: None Level of assistance: Complete Independence Comments: Antalgic gait pattern with LLE circumduction. W/ knee brace locked @ 60 degrees.    TODAY'S TREATMENT:                                                                                                                              DATE:  06/18/23 Bike seat 13 x 5 dynamic warm up Progress note 2 MWT  514 ft LEFS 62/80 ROM  MMT's see above   06/15/23 Elliptical warm up x 4' Leg press 4 plates left leg only 3 x 10 Sled push/pull 70# x 20 ft x 8 reps SLS on foam red med ball toss 2 x 20 Jog, skip and carioca down hallway x 30 ft down and back each Sit to stand from low mat table 20# with right foot forward 2 x 10      05/31/23 Ellipitical x 4' dynamic warm up Box lift table to floor 15# x 5 Box lift table to floor 30# x 5 Left leg press 4 plates 3 x 10 16# sled push pull x 25 ft down and back x 5 7" step downs 2 x 10 Line jumps F/B; and S/S 3 x 15" Tramp weight shifts/ bounce 3 x 30" Tramp ball toss  SLS on foam 2 x 20 3 way tap standing on left leg 2 x 10   05/27/23 Ellipitical x 3' dynamic warm up  20# calf raises on incline x 20 20# dead lifts 2 x 10 70# sled push pull x 25 ft down and back x 5 3 plates left leg press 2 x 15; 4 plates x 8 95# backwards lunges 3 x 10 Calf  stretch on step 3 x 20" Hamstring stretching 3 x 20" Quad stretch standing 3 x 20" Step downs; right heel tap 7" step 2 x 10  05/25/23 Recumbent bike x 5'; seat 12; level 3 full revolutions dynamic warm up   Bodycraft Knee extension 2 plates 2 x 10 62# (10# dumbells each hand) calf raises on incline x 20 Hamstring stretch 3 x 20" each Calf stretch 3 x 20" each  Sit to stand 20# weight with right foot extended 2 x 10  SLS ball toss red ball 2 x 20 on blue foam 10# weight sumo squat 2 x 10 3 plates  leg press left only 2 x 10   05/21/23 Recumbent bike x 5'; seat 12; full revolutions dynamic warm up   Sit to stand 10# weight with right foot extended out 2 x 10 Split squat 10#  2 x 10 Calf raises x 10 both, x 10 left leg Calf stretch on step 3 x 20" Left leg 3 plates leg press 3 x 10 Quad stretch in standing 5 x 20"  05/03/23 Recumbent bike x 5'; seat 12; full revolutions dynamic warm up   SLR without lag 2 x 10 AROM and MMT's see above LEFS 53/80 66.3% 2 MWT 414 ft Girth 16 cm about superior pole of patella  right quad 48 cm  left quad 42.75 cm  Leg press bodycraft 3 plates 2 x 10  10/29/863 Recumbent bike x 7'; seat 12; full revolutions dynamic warm up  Body craft Single leg press 2 plates 2 x 10 Cybex hamstring curls 2.5 plates 2 x 10 Single leg deadlift 10#  2 x 10 Lateral step over cones x 5 with thick blue band      04/09/2023  -Recumbent bike x 7'; seat 12; full revolutions.  -Modified wall sit w/ 2in under RLE; 10 x 7' hold. Visual cues with mirror.  -2in stepups x 20; cues for proper movement patterns and no UE support -Forward eccentric 2in step downs w/ LLE x 5 BUE support.  -Heel raises; 2up;1down w/ LLE x 20 -2in box for 1 x 20 LLE Knee flexionw/ 2lb ankle weight.  -1 x 10 with 4lb ankle weight  04/06/2023  -Recumbent bike x 7'; seat 13; Full rotation. -45 degree wall sits w/ mirror for visual cues 10x 7-10' -2x 15 RTB standing hip adduction  -2x 15  RTB standing hip abduction -Heel raises; bilateral concentric, unilateral eccentric with LLE x 20.  -2in stepups w/o BUE support. x20 -2in stepdowns w/ single UE support. X20 -bodycraft TKE #1 plate x 12  05/03/4695  -Recumbent Bike x7; achieving full rotations. Seat 14 -2in stepups with RUE support x 10;  -Blue TB TKE x 30 -Mini wall squats 15 x 5' hold  -Standing knee flexion w/ 2lb ankle weight 2 x 12 repetitions.  -Standing hip abduction 1 x 20 repetitions.  -44ft gait training. Cues for continue L weight shift and knee flexion during swing.     PATIENT EDUCATION:  Education details:03/12/2023 Using towel underneath for quad set.  Education method:  Explanation and Demonstration Education comprehension: verbalized understanding and returned demonstration  HOME EXERCISE PROGRAM: Access Code: [email or web address] URL: [email or web address]  03/08/23- Standing Heel Raises  - 3 x daily - 7 x weekly - 2 sets - 20 reps - Toe Raises with Counter Support  - 3 x daily - 7 x weekly - 2 sets - 20 reps - Mini Squat with Counter Support  - 3 x daily - 7 x weekly - 3 sets - 10 reps  Date: 03/05/2023 - Sidelying Hip Abduction  - 1 x daily - 7 x weekly - 3 sets - 10 reps - Sidelying Hip Adduction  - 1 x daily - 7 x weekly - 3 sets - 10 reps - Prone Hip Extension  - 1 x daily - 7 x weekly - 3 sets - 10 reps - Supine Quadricep Sets  - 1 x daily - 7 x weekly - 3 sets - 10 reps - Supine Heel Slide  - 1 x daily - 7 x weekly - 3 sets - 10 reps - Long Sitting Ankle Plantar Flexion with Resistance  - 1 x daily - 7 x weekly - 3 sets - 10 reps - Long Sitting Ankle Dorsiflexion with Anchored Resistance  - 1 x daily - 7 x weekly - 3 sets - 10 reps - Long Sitting Ankle Inversion with Resistance  - 1 x daily - 7 x weekly - 3 sets - 10 reps - Long Sitting Ankle Eversion with Resistance  - 1 x daily - 7 x weekly - 3 sets - 10 reps  ASSESSMENT:  CLINICAL IMPRESSION: Progress note/discharge note today.   All set rehab goals met and patient ready for discharge.   Today's session continued to focus on lower extremity strengthening and functional strength and movement. Added box lift today to simulate lifting at a job he is interested in; working on proper lifting form to avoid injury.  Patient able to demonstrate good form with minimal cues.  Able to increase leg press to 4 plates.started some agility work/jumping today as sports and exercise have been a big part of patient's life.  Max challenge with Patient works hard in therapy and is progressing well.  Patient will benefit from continued skilled therapy services to address deficits and promote return to optimal function.      OBJECTIVE IMPAIRMENTS: Abnormal gait, decreased mobility, decreased ROM, decreased strength, hypomobility, impaired flexibility, and pain.   ACTIVITY LIMITATIONS: carrying, lifting, sitting, standing, squatting, sleeping, stairs, transfers, and locomotion level  PARTICIPATION LIMITATIONS: community activity, occupation, and yard work  PERSONAL FACTORS: Age are also affecting patient's functional outcome.   REHAB POTENTIAL: Excellent  CLINICAL DECISION MAKING: Stable/uncomplicated  EVALUATION COMPLEXITY: Low   GOALS: Goals reviewed with patient? No  SHORT TERM GOALS: Target date: 03/26/2023  Pt and caregivers will be independent with HEP in order to demonstrate participation in Physical Therapy POC.  Baseline: Goal status: MET  2.  Pt will report 10% increase in subjective functional performance in order to demonstrate improved functional capacity.  Baseline: Pt reports 40% Goal status: MET  LONG TERM GOALS: Target date: 04/16/2023  Pt will ambulate with nonantalgic symmetrical normal gait biomechanics for >500 feet in order to demonstrate increase in functional mobility. Baseline: See objective Goal status: MET  2.  Pt will increase LLE MMT to +4/5 or equal to RLE MMT in order to demonstrate increased  muscular strength gain. Baseline: See objective Goal status: MET  3.  Pt will improve L knee range  of motion to 120 degrees or equal to R knee range of motion to improve upright functional mobility. Baseline: See objective Goal status: MET  4.  Pt will improve LFES score by >50% of original score in order to demonstrate improved functional use of LLE. Baseline: see objective Goal status: MET  PLAN:  PT FREQUENCY: 2x/week  PT DURATION: 6 weeks  PLANNED INTERVENTIONS: Therapeutic exercises, Therapeutic activity, Neuromuscular re-education, Balance training, Gait training, Patient/Family education, Self Care, Joint mobilization, Joint manipulation, Stair training, Dry Needling, Electrical stimulation, Cryotherapy, scar mobilization, Manual therapy, and Re-evaluation  PLAN FOR NEXT SESSION: discharge  1:28 PM, 06/18/23 Matt Delpizzo Small Ashon Rosenberg MPT [facility] [place] [phone] Ph:[phone]

## 2023-08-27 ENCOUNTER — Encounter: Payer: Self-pay | Admitting: Orthopedic Surgery

## 2023-08-27 ENCOUNTER — Ambulatory Visit: Payer: Medicaid Other | Admitting: Orthopedic Surgery

## 2023-08-27 VITALS — Ht 70.0 in | Wt 153.0 lb

## 2023-08-27 DIAGNOSIS — S82035D Nondisplaced transverse fracture of left patella, subsequent encounter for closed fracture with routine healing: Secondary | ICD-10-CM

## 2023-08-27 NOTE — Progress Notes (Signed)
Follow up appt   Chief Complaint  Patient presents with   Knee Pain    follow up left knee  patient says no pain he is jogging and running and playing some basketball  Leftpatella inf pole fx orif 01/27/23   Encounter Diagnosis  Name Primary?   Closed nondisplaced transverse fracture of left patella with routine healing, subsequent encounter Yes   22 year old male 7 months after fracturing the inferior pole of his patella he had an ORIF he had slow healing process in terms of regaining his range of motion  He had a little bit of discomfort when he started running that has resolved  His exam shows he is walking without any limp he has full range of motion of his knee he has good extension and good strength on extension  The patient can resume all normal activities follow-up as needed
